# Patient Record
Sex: Female | Born: 1990 | Race: White | Hispanic: No | Marital: Married | Smoking: Former smoker
Health system: Southern US, Community
[De-identification: ages and names within clinical notes are randomized; demographics above are authoritative.]

## PROBLEM LIST (undated history)

## (undated) DIAGNOSIS — Q210 Ventricular septal defect: Secondary | ICD-10-CM

## (undated) DIAGNOSIS — R0600 Dyspnea, unspecified: Secondary | ICD-10-CM

## (undated) DIAGNOSIS — K047 Periapical abscess without sinus: Secondary | ICD-10-CM

## (undated) DIAGNOSIS — O24419 Gestational diabetes mellitus in pregnancy, unspecified control: Secondary | ICD-10-CM

## (undated) HISTORY — PX: OTHER SURGICAL HISTORY: SHX169

## (undated) HISTORY — DX: Dyspnea, unspecified: R06.00

## (undated) HISTORY — DX: Gestational diabetes mellitus in pregnancy, unspecified control: O24.419

## (undated) HISTORY — PX: VSD REPAIR: SHX276

## (undated) HISTORY — DX: Periapical abscess without sinus: K04.7

---
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
## 2011-12-16 ENCOUNTER — Ambulatory Visit (INDEPENDENT_AMBULATORY_CARE_PROVIDER_SITE_OTHER): Payer: BC Managed Care – PPO | Admitting: Physician Assistant

## 2011-12-16 VITALS — BP 131/78 | HR 90 | Temp 99.5°F | Resp 16 | Ht 61.5 in | Wt 114.6 lb

## 2011-12-16 DIAGNOSIS — R069 Unspecified abnormalities of breathing: Secondary | ICD-10-CM

## 2011-12-16 DIAGNOSIS — J069 Acute upper respiratory infection, unspecified: Secondary | ICD-10-CM

## 2011-12-16 DIAGNOSIS — R059 Cough, unspecified: Secondary | ICD-10-CM

## 2011-12-16 DIAGNOSIS — R05 Cough: Secondary | ICD-10-CM

## 2011-12-16 MED ORDER — IPRATROPIUM BROMIDE 0.03 % NA SOLN: 2.0000 | Freq: Three times a day (TID) | NASAL | Status: AC

## 2011-12-16 MED ORDER — HYDROCOD POLST-CHLORPHEN POLST 10-8 MG/5ML PO LQCR: 5.0000 mL | Freq: Two times a day (BID) | ORAL | Status: DC

## 2011-12-16 MED ORDER — GUAIFENESIN ER 1200 MG PO TB12: 1.0000 | ORAL_TABLET | Freq: Two times a day (BID) | ORAL | Status: DC

## 2011-12-16 NOTE — Progress Notes (Signed)
  Subjective:    Patient ID: Kayla Bradley, female    DOB: 14-Oct-1990, 21 y.o.   MRN: [id number]  HPI  Pt presents to clinic with 4 d h/o cold symptoms.  Pt has congestion with improved rhinorrhea, ? Color.  PND with cough more productive in the am does not know color.  She irritated throat.  No OTC meds, sister has been sick.  Increased cough at night.  HA and low grade fever at beginning of illness.  Pt is a nonsmoker with no h/o asthma.  Review of Systems  Constitutional: Positive for fever. Negative for chills.  HENT: Positive for congestion, sore throat (irritated), rhinorrhea, postnasal drip and sinus pressure. Negative for ear pain.   Respiratory: Positive for cough (increase at night).   Gastrointestinal: Negative for nausea, vomiting and diarrhea.  Musculoskeletal: Negative for myalgias.  Neurological: Positive for headaches. Negative for dizziness.       Objective:   Physical Exam  Nursing note and vitals reviewed. Constitutional: She is oriented to person, place, and time. She appears well-developed and well-nourished.  HENT:  Head: Normocephalic and atraumatic.  Right Ear: Hearing, tympanic membrane, external ear and ear canal normal.  Left Ear: Hearing, tympanic membrane and external ear normal.  Nose: Mucosal edema (red) present.  Mouth/Throat: Uvula is midline and oropharynx is clear and moist.  Eyes: Conjunctivae are normal.  Neck: Neck supple.  Cardiovascular: Normal rate, regular rhythm and normal heart sounds.   Pulmonary/Chest: Effort normal and breath sounds normal.  Lymphadenopathy:    She has no cervical adenopathy.  Neurological: She is alert and oriented to person, place, and time.  Skin: Skin is warm and dry.  Psychiatric: She has a normal mood and affect. Her behavior is normal. Judgment and thought content normal.          Assessment & Plan:   1. Acute upper respiratory infections of unspecified site  chlorpheniramine-HYDROcodone  (TUSSIONEX PENNKINETIC ER) 10-8 MG/5ML LQCR, Guaifenesin (MUCINEX MAXIMUM STRENGTH) 1200 MG TB12, ipratropium (ATROVENT) 0.03 % nasal spray  2. Cough  chlorpheniramine-HYDROcodone (TUSSIONEX PENNKINETIC ER) 10-8 MG/5ML LQCR, Guaifenesin (MUCINEX MAXIMUM STRENGTH) 1200 MG TB12, ipratropium (ATROVENT) 0.03 % nasal spray   Push fluids, tylenol/motrin prn.

## 2012-08-08 ENCOUNTER — Ambulatory Visit (INDEPENDENT_AMBULATORY_CARE_PROVIDER_SITE_OTHER): Payer: BC Managed Care – PPO | Admitting: Family Medicine

## 2012-08-08 ENCOUNTER — Encounter: Payer: Self-pay | Admitting: Family Medicine

## 2012-08-08 VITALS — BP 121/62 | HR 73 | Temp 98.2°F | Resp 18 | Ht 62.0 in | Wt 111.0 lb

## 2012-08-08 DIAGNOSIS — M419 Scoliosis, unspecified: Secondary | ICD-10-CM

## 2012-08-08 DIAGNOSIS — M4120 Other idiopathic scoliosis, site unspecified: Secondary | ICD-10-CM

## 2012-08-08 MED ORDER — IBUPROFEN 800 MG PO TABS: ORAL_TABLET | ORAL | Status: DC

## 2012-08-08 MED ORDER — METAXALONE 800 MG PO TABS: ORAL_TABLET | ORAL | Status: DC

## 2012-08-08 NOTE — Patient Instructions (Signed)
Scoliosis Scoliosis is the name given to a spine that curves sideways. It is a common condition found in up to ten percent of adolescents. It is more common in teenage girls. This is sometimes the result of other underlying problems such as unequal leg length or muscular problems. Approximately 70% of the time the cause unknown. It can cause twisting of the shoulders, hips, chest, back, and rib cage. Exercises generally do not affect the course of this disease, but may be helpful in strengthening weak muscle groups. Orthopedic braces may be needed during growth spurts. Surgery may be necessary for progressive cases. HOME CARE INSTRUCTIONS   Your caregiver may suggest exercises to strengthen your muscles. Follow their instructions. Ask your caregiver if you can participate in sports activities.  Bracing may be needed to try to limit the progression of the spinal curve. Wear the brace as instructed by your caregiver.  Follow-up appointments are important. Often mild cases of scoliosis can be kept track of by regular physical exams. However, periodic x-rays may be taken in more severe cases to follow the progress of the curvature, especially with brace treatment. Scoliosis can be corrected or improved if treated early. SEEK IMMEDIATE MEDICAL CARE IF:  You have back pain that is not relieved by medications prescribed by your caregiver.  If there is weakness or increased muscle tone (spasticity) in your legs or any loss of bowel or bladder control. Document Released: 09/16/2000 Document Revised: 12/12/2011 Document Reviewed: 10/06/2008 [facility] Patient Information 2013 [place], [place].  You may want to see a Chiropractor for treatment of back pain associated with scoliosis. Have your parents check their insurance plan to see if it will cover this type of treatment. If so, then contact the practice of your choice to see if they accept the insurance and will schedule a appointment for you.

## 2012-08-08 NOTE — Progress Notes (Signed)
S:  This young 21 y.o. Cauc female is here for evaluation of back pain about 1 month ago. No hx of trauma or overuse. No other associated symptoms (no cough, fever/ chills, GU/GI symptoms). She admits that she may need a new mattress but has a styrofoam topper on her bed.   ROS: As per HPI.  O: Filed Vitals:   08/08/12 1039  BP: 121/62  Pulse: 73  Temp: 98.2 F (36.8 C)  Resp: 18   GEN: In NAD; WN,WD. HEENT: [place]/AT; otherwise normal. COR: RRR. LUNGS: CTA. SPINE: Mild thoracic curvature noted with rib hump to right with forward flexion. BACK: No CVAT. NEURO: A&O x 3; CNs intact. Nonfocal.   A/P: 1. Thoracic scoliosis     RX: Ibuprofen 800 mg 1/2 tablet 3 times a day prn RX: Skelaxin (generic) 800 mg  1/2 tablet hs for muscle spasms. Topical analgesic 2-3 times daily. Chiropractic treatment may be helpful.

## 2015-12-15 ENCOUNTER — Ambulatory Visit (INDEPENDENT_AMBULATORY_CARE_PROVIDER_SITE_OTHER): Payer: BLUE CROSS/BLUE SHIELD | Admitting: Physician Assistant

## 2015-12-15 VITALS — BP 130/80 | HR 72 | Temp 98.1°F | Resp 16 | Ht 62.0 in | Wt 114.6 lb

## 2015-12-15 DIAGNOSIS — R21 Rash and other nonspecific skin eruption: Secondary | ICD-10-CM

## 2015-12-15 DIAGNOSIS — N63 Unspecified lump in breast: Secondary | ICD-10-CM | POA: Diagnosis not present

## 2015-12-15 DIAGNOSIS — N6310 Unspecified lump in the right breast, unspecified quadrant: Secondary | ICD-10-CM

## 2015-12-15 LAB — POCT SKIN KOH: Skin KOH, POC: NEGATIVE

## 2015-12-15 NOTE — Patient Instructions (Addendum)
Pityriasis Rosea Pityriasis rosea is a rash that usually appears on the trunk of the body. It may also appear on the upper arms and upper legs. It usually begins as a single patch, and then more patches begin to develop. The rash may cause mild itching, but it normally does not cause other problems. It usually goes away without treatment. However, it may take weeks or months for the rash to go away completely. CAUSES The cause of this condition is not known. The condition does not spread from person to person (is noncontagious). RISK FACTORS This condition is more likely to develop in young adults and children. It is most common in the spring and fall. SYMPTOMS The main symptom of this condition is a rash.  The rash usually begins with a single oval patch that is larger than the ones that follow. This is called a herald patch. It generally appears a week or more before the rest of the rash appears.  When more patches start to develop, they spread quickly on the trunk, back, and arms. These patches are smaller than the first one.  The patches that make up the rash are usually oval-shaped and pink or red in color. They are usually flat, but they may sometimes be raised so that they can be felt with a finger. They may also be finely crinkled and have a scaly ring around the edge.  The rash does not typically appear on areas of the skin that are exposed to the sun. Most people who have this condition do not have other symptoms, but some have mild itching. In a few cases, a mild headache or body aches may occur before the rash appears and then go away. DIAGNOSIS Your health care provider may diagnose this condition by doing a physical exam and taking your medical history. To rule out other possible causes for the rash, the health care provider may order blood tests or take a skin sample from the rash to be looked at under a microscope. TREATMENT Usually, treatment is not needed for this condition.  The rash will probably go away on its own in 4-8 weeks. In some cases, a health care provider may recommend or prescribe medicine to reduce itching. HOME CARE INSTRUCTIONS  Take medicines only as directed by your health care provider.  Avoid scratching the affected areas of skin.  Do not take hot baths or use a sauna. Use only warm water when bathing or showering. Heat can increase itching. SEEK MEDICAL CARE IF:  Your rash does not go away in 8 weeks.  Your rash gets much worse.  You have a fever.  You have swelling or pain in the rash area.  You have fluid, blood, or pus coming from the rash area.   This information is not intended to replace advice given to you by your health care provider. Make sure you discuss any questions you have with your health care provider.   Document Released: 10/26/2001 Document Revised: 02/03/2015 Document Reviewed: 08/27/2014 Elsevier Interactive Patient Education 2016 Elsevier Inc.    IF you received an x-ray today, you will receive an invoice from [facility]. Please contact [facility] at [phone] with questions or concerns regarding your invoice.   IF you received labwork today, you will receive an invoice from [facility]. Please contact [facility] at [phone] with questions or concerns regarding your invoice.   Our billing staff will not be able to assist you with questions regarding bills from these companies.    You will be contacted with the lab results as soon as they are available. The fastest way to get your results is to activate your My Chart account. Instructions are located on the last page of this paperwork. If you have not heard from us regarding the results in 2 weeks, please contact this office.      

## 2015-12-15 NOTE — Progress Notes (Signed)
Subjective:     Patient ID: Kayla Bradley,Kayla Bradley female   DOB: 02/03/1991, 25 y.o.   MRN: [id number]  HPI The patient is a 25 year old female with no significant past medical history who presents with a rash x3 weeks. The patient states she notices a spot on the corner of her right breast 3 weeks ago, then the rash spread to her upper extremities, abdomen and trunk. She denies itching, states the rash is noticeable but not bothersome. Denies pain or tenderness. No recent nausea, vomiting or sun exposure. The patient has no change in lotions, medications, conditioners, no recent travel or camping exposure and no known family members or partners with a similar rash. Denies fever, chills or night sweats.  The patient also states the noticed a breast mass 1 week ago on her right breast that has been persistent since. It has not increased in size, changes shape or been painful since discovered  Review of Systems All the pertinent ROS as above in HPI    Objective:   Physical Exam  Constitutional: She appears well-developed and well-nourished. No distress.  HENT:  Head: Normocephalic and atraumatic.  Neck: Neck supple.  Cardiovascular: Normal rate, regular rhythm, normal heart sounds and intact distal pulses.  Exam reveals no gallop and no friction rub.   No murmur heard. Pulmonary/Chest: Breath sounds normal. No respiratory distress. She has no wheezes. She has no rales.  Genitourinary: There is breast tenderness.  Deep breast mass at 12 o'clock superior to the areola, mildly tender, 4cm length by 3cm wide, firm, non-mobile, anchored to underlying tissue.  Lymphadenopathy:    She has no cervical adenopathy.  Skin:  Dry, scaly patch with erythematous boarder and central clearing on the right breast at 10 o'clock. Erythematous dry patches of varying sizes disseminated over back, anterior chest wall, abdomen and flexor upper extremity surfaces.   Results for orders placed or performed in visit on  12/15/15  POCT Skin KOH  Result Value Ref Range   Skin KOH, POC Negative        Assessment:     The patient is a 25 year old female with no significant past medical history who presents with a rash X3 weeks, starting in one spot and migrated to several body surfaces. No pain or itching associated. 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 Also, large breast mass in a 25 year old female with no significant history, send in referral for ultrasound of breast at this time.     Plan:     1. Rash and nonspecific skin eruption - POCT Skin KOH Negative - Encouraged patient to use OTC hydrocortisone cream if rash becomes pruritic - Reassured patient rash should be self-limiting, if rash persists or worsens please come back to the office  2. Breast mass, right - US BREAST LTD UNI RIGHT INC AXILLA; Future

## 2015-12-18 NOTE — Progress Notes (Signed)
   Concha SeKatherine M Martelli  MRN: [id number] DOB: 06/05/1991  Subjective:  The patient is a 25 year old female with no significant past medical history who presents with a rash x3 weeks. The patient states she notices a spot on the corner of her right breast 3 weeks ago, then the rash spread to her upper extremities, abdomen and trunk. She denies itching, states the rash is noticeable but not bothersome. Denies pain or tenderness. No recent nausea, vomiting or sun exposure. The patient has no change in lotions, medications, conditioners, no recent travel or camping exposure and no known family members or partners with a similar rash. Denies fever, chills or night sweats.  The patient also states the noticed a breast mass 1 week ago on her right breast that has been persistent since. It has not increased in size, changes shape or been painful since discovered   Patient Active Problem List   Diagnosis Date Noted  . Thoracic scoliosis 08/08/2012    Current Outpatient Prescriptions on File Prior to Visit  Medication Sig Dispense Refill  . Guaifenesin (MUCINEX MAXIMUM STRENGTH) 1200 MG TB12 Take 1 tablet (1,200 mg total) by mouth 2 (two) times daily. (Patient not taking: Reported on 12/15/2015) 14 each 0  . ibuprofen (ADVIL,MOTRIN) 800 MG tablet Take 1/2 tablet 3 times a day with food prn pain. (Patient not taking: Reported on 12/15/2015) 60 tablet 0  . metaxalone (SKELAXIN) 800 MG tablet Take 1/2 tablet at bedtime to relax back muscles. (Patient not taking: Reported on 12/15/2015) 40 tablet 0  . Pseudoephedrine-APAP-DM (DAYQUIL MULTI-SYMPTOM) 60-650-20 MG/30ML LIQD Take by mouth. Reported on 12/15/2015     No current facility-administered medications on file prior to visit.    No Known Allergies  Review of Systems Objective:  BP 130/80 mmHg  Pulse 72  Temp(Src) 98.1 F (36.7 C) (Oral)  Resp 16  Ht 5\' 2"  (1.575 m)  Wt 114 lb 9.6 oz (51.982 kg)  BMI 20.96 kg/m2  SpO2 99%  LMP  12/11/2015  Physical Exam  Constitutional: She is oriented to person, place, and time and well-developed, well-nourished, and in no distress.  HENT:  Head: Normocephalic and atraumatic.  Right Ear: Hearing and external ear normal.  Left Ear: Hearing and external ear normal.  Eyes: Conjunctivae are normal.  Neck: Normal range of motion.  Cardiovascular: Normal rate, regular rhythm and normal heart sounds.   No murmur heard. Pulmonary/Chest: Effort normal and breath sounds normal. She has no wheezes.    Neurological: She is alert and oriented to person, place, and time. Gait normal.  Skin: Skin is warm and dry. Rash (light erythematous oval pataches with central clearing and some edge scaling - larger paler colored lesion on anterior right chest wall) noted.  Psychiatric: Mood, memory, affect and judgment normal.  Vitals reviewed.  Results for orders placed or performed in visit on 12/15/15  POCT Skin KOH  Result Value Ref Range   Skin KOH, POC Negative     Assessment and Plan :  Rash and nonspecific skin eruption - Plan: POCT Skin KOH - likely pityriasis rosea - d/w pt the self limiting process that may take 4-6 weeks for complete resolution.  Breast mass, right - Plan: US BREAST LTD UNI RIGHT INC AXILLA - check US to look at type of lesion.  Her questions were answered and she agreed with the plan  Benny LennertSarah Keino Placencia PA-C  [facility] 12/18/2015 10:12 AM

## 2016-03-30 ENCOUNTER — Telehealth: Payer: Self-pay

## 2016-03-30 NOTE — Telephone Encounter (Signed)
Patient is calling to see if she can get a referral for a mammogram. Patient states that she [facility] and [facility]'t able to. I informed patient that referral tried to reach her.  Please advise! [phone]

## 2016-03-31 NOTE — Telephone Encounter (Signed)
The patient is too young for a mammogram, but she can have a breast ultrasound performed.  There is an order in the system from March that is still open, so [facility] can [facility] and schedule with them.  It appears they had tried to reach her several times with the number listed in the system, but had been unsuccessful.  Their phone number is [phone].

## 2016-04-06 ENCOUNTER — Ambulatory Visit
Admission: RE | Admit: 2016-04-06 | Discharge: 2016-04-06 | Disposition: A | Discharge status: Home / Self Care | Payer: BLUE CROSS/BLUE SHIELD | Source: Ambulatory Visit | Attending: Physician Assistant | Admitting: Physician Assistant

## 2016-04-06 DIAGNOSIS — N6310 Unspecified lump in the right breast, unspecified quadrant: Secondary | ICD-10-CM

## 2016-08-26 ENCOUNTER — Ambulatory Visit (INDEPENDENT_AMBULATORY_CARE_PROVIDER_SITE_OTHER): Payer: BLUE CROSS/BLUE SHIELD | Admitting: Family Medicine

## 2016-08-26 VITALS — BP 118/80 | HR 74 | Temp 98.2°F | Resp 16 | Ht 62.0 in | Wt 105.0 lb

## 2016-08-26 DIAGNOSIS — M25541 Pain in joints of right hand: Secondary | ICD-10-CM

## 2016-08-26 DIAGNOSIS — D72828 Other elevated white blood cell count: Secondary | ICD-10-CM

## 2016-08-26 DIAGNOSIS — R21 Rash and other nonspecific skin eruption: Secondary | ICD-10-CM | POA: Diagnosis not present

## 2016-08-26 DIAGNOSIS — R112 Nausea with vomiting, unspecified: Secondary | ICD-10-CM | POA: Diagnosis not present

## 2016-08-26 DIAGNOSIS — R109 Unspecified abdominal pain: Secondary | ICD-10-CM

## 2016-08-26 DIAGNOSIS — R590 Localized enlarged lymph nodes: Secondary | ICD-10-CM | POA: Diagnosis not present

## 2016-08-26 DIAGNOSIS — M25542 Pain in joints of left hand: Secondary | ICD-10-CM

## 2016-08-26 LAB — COMPLETE METABOLIC PANEL WITH GFR
ALBUMIN: 4.7 g/dL (ref 3.6–5.1)
ALK PHOS: 41 U/L (ref 33–115)
ALT: 8 U/L (ref 6–29)
AST: 12 U/L (ref 10–30)
BILIRUBIN TOTAL: 0.3 mg/dL (ref 0.2–1.2)
BUN: 9 mg/dL (ref 7–25)
CO2: 25 mmol/L (ref 20–31)
CREATININE: 0.71 mg/dL (ref 0.50–1.10)
Calcium: 9.5 mg/dL (ref 8.6–10.2)
Chloride: 104 mmol/L (ref 98–110)
GFR, Est African American: >89 mL/min (ref >=60)
GFR, Est Non African American: >89 mL/min (ref >=60)
GLUCOSE: 91 mg/dL (ref 65–99)
Potassium: 4.2 mmol/L (ref 3.5–5.3)
SODIUM: 137 mmol/L (ref 135–146)
TOTAL PROTEIN: 7.2 g/dL (ref 6.1–8.1)

## 2016-08-26 LAB — POCT CBC
Granulocyte percent: 68.2 %G (ref 37–80)
HEMATOCRIT: 41.2 % (ref 37.7–47.9)
HEMOGLOBIN: 14.7 g/dL (ref 12.2–16.2)
LYMPH, POC: 2.8 (ref 0.6–3.4)
MCH, POC: 33.1 pg — AB (ref 27–31.2)
MCHC: 35.8 g/dL — AB (ref 31.8–35.4)
MCV: 92.6 fL (ref 80–97)
MID (cbc): 0.9 (ref 0–0.9)
MPV: 7.2 fL (ref 0–99.8)
POC GRANULOCYTE: 7.9 — AB (ref 2–6.9)
POC LYMPH %: 24.3 % (ref 10–50)
POC MID %: 7.5 %M (ref 0–12)
Platelet Count, POC: 232 10*3/uL (ref 142–424)
RBC: 4.45 M/uL (ref 4.04–5.48)
RDW, POC: 12.3 %
WBC: 11.6 10*3/uL — AB (ref 4.6–10.2)

## 2016-08-26 LAB — POCT URINE PREGNANCY: Preg Test, Ur: NEGATIVE

## 2016-08-26 MED ORDER — TRIAMCINOLONE ACETONIDE 0.1 % EX CREA: 1.0000 "application " | TOPICAL_CREAM | Freq: Two times a day (BID) | CUTANEOUS | 0 refills | Status: DC

## 2016-08-26 MED ORDER — DOXYCYCLINE HYCLATE 100 MG PO CAPS: 100.0000 mg | ORAL_CAPSULE | Freq: Two times a day (BID) | ORAL | 0 refills | Status: AC

## 2016-08-26 NOTE — Patient Instructions (Addendum)
  Start Doxycycline with food, 100 mg twice daily for 21 days for facial rash.    Apply triamcinolone twice daily to affected areas. I will notify you of your labs.  If all are normal, I will refer you to dermatology.  [name], MSN, FNP-C [facility]  IF you received an x-ray today, you will receive an invoice from [facility]. Please contact [facility] at [phone] with questions or concerns regarding your invoice.   IF you received labwork today, you will receive an invoice from [facility]. Please contact [facility] at [phone] with questions or concerns regarding your invoice.   Our billing staff will not be able to assist you with questions regarding bills from these companies.  You will be contacted with the lab results as soon as they are available. The fastest way to get your results is to activate your My Chart account. Instructions are located on the last page of this paperwork. If you have not heard from us regarding the results in 2 weeks, please contact this office.

## 2016-08-26 NOTE — Progress Notes (Signed)
Patient ID: Kayla Bradley, female    DOB: 01/27/1991, 25 y.o.   MRN: [id number]  PCP: No PCP Per Patient  Chief Complaint  Patient presents with  . FaConcha Secial rash    Right side of face x 3 days    Subjective:   HPI 25 year old female presents for evaluation of facial rash.  [facility]  Patient provides an account of skin rashes at least three since late winter of 2017 in which she was seen and evaluated [facility] and at area urgent cares. She reports the current rash on her face was dx at another facility as impetigo and she was treated with a topical. Rash never resolved.  One rash [facility] was [facility] appeared upper right breast later spreading to her stomach which she reports is similar to the rash she has today.  She is almost certain she received treatment with an oral medication although not certain of which medication. Reports an almost 15 lb weight loss without trying or changing dietary intake. In addition to weight loss, over the last several month she complains of joint pain hands, knees, arms, and legs. This week, she has experienced some abdominal pain with nausea and one episode of vomiting.  Social History   Social History  . Marital status: Single    Spouse name: N/A  . Number of children: N/A  . Years of education: N/A   Occupational History  . Host     [employer]   Social History Main Topics  . Smoking status: Never Smoker  . Smokeless tobacco: Not on file  . Alcohol use 0.6 oz/week    1 Cans of beer per week  . Drug use:     Frequency: 7.0 times per week    Types: Marijuana  . Sexual activity: Yes    Birth control/ protection: Condom   Other Topics Concern  . Not on file   Social History Narrative  . No narrative on file   Family History  Problem Relation Age of Onset  . Diabetes Father   . Heart disease Maternal Grandfather    Review of Systems See HPI  Patient Active Problem  List   Diagnosis Date Noted  . Thoracic scoliosis 08/08/2012     Prior to Admission medications   Medication Sig Start Date End Date Taking? Authorizing Provider  metaxalone (SKELAXIN) 800 MG tablet Take 1/2 tablet at bedtime to relax back muscles. Patient not taking: Reported on 08/26/2016 08/08/12   Maurice MarchBarbara B McPherson, MD   No Known Allergies     Objective:  Physical Exam  Constitutional: She is oriented to person, place, and time. She appears well-developed and well-nourished.  HENT:  Head: Normocephalic and atraumatic.  Eyes: Pupils are equal, round, and reactive to light.  Neck: Normal range of motion. Neck supple. No thyromegaly present.  Cardiovascular: Normal rate, regular rhythm, normal heart sounds and intact distal pulses.   Pulmonary/Chest: Effort normal and breath sounds normal.  Musculoskeletal: Normal range of motion.  Lymphadenopathy:    She has cervical adenopathy.  Neurological: She is alert and oriented to person, place, and time.  Skin: Skin is warm and dry. Rash noted. Rash is macular.     Psychiatric: She has a normal mood and affect. Her behavior is normal. Judgment and thought content normal.   Vitals:   08/26/16 1010  BP: 118/80  Pulse: 74  Resp: 16  Temp:  98.2 F (36.8 C)     Assessment & Plan:  1. Rash and nonspecific skin eruption 2. Arthralgia of both hands 3. Adenopathy, cervical 4. Nausea and vomiting, intractability of vomiting not specified, unspecified vomiting type 5. Abdominal pain, unspecified abdominal location 6. Other elevated white blood cell (WBC) count  [age] year old female, presents with a cluster of symptoms including unintentional weight loss, joint pain, and reoccurring skin rash. Positive ANA and anti-nuclear anti-body, indicates possible autoimmune process. Referring to Rheumatology for further work-up. CBC indicated an elevated WBC with elevated granulocytes. Rash on face erythremic with increased warmth.   -Start  Doxycycline with food, 100 mg twice daily for 21 days for facial rash.    Apply triamcinolone twice daily to affected areas of your face.  Return for follow-up as needed.  [name], MSN, FNP-C [facility]

## 2016-08-27 LAB — SEDIMENTATION RATE: Sed Rate: 1 mm/hr (ref 0–20)

## 2016-08-27 LAB — GC/CHLAMYDIA PROBE AMP
CT PROBE, AMP APTIMA: NOT DETECTED
GC Probe RNA: NOT DETECTED

## 2016-08-27 LAB — HIV ANTIBODY (ROUTINE TESTING W REFLEX): HIV: NONREACTIVE

## 2016-08-27 LAB — RPR

## 2016-08-29 LAB — ANA, IFA COMPREHENSIVE PANEL
Anti Nuclear Antibody(ANA): POSITIVE — AB
DS DNA AB: 2 [IU]/mL
ENA SM AB SER-ACNC: NEGATIVE
SCLERODERMA (SCL-70) (ENA) ANTIBODY, IGG: NEGATIVE
SM/RNP: NEGATIVE
SSA (Ro) (ENA) Antibody, IgG: <1
SSB (LA) (ENA) ANTIBODY, IGG: NEGATIVE

## 2016-08-29 LAB — ANTI-NUCLEAR AB-TITER (ANA TITER)

## 2016-08-30 ENCOUNTER — Encounter: Payer: Self-pay | Admitting: Family Medicine

## 2016-10-28 NOTE — Progress Notes (Signed)
Office Visit Note  Patient: Kayla Bradley             Date of Birth: 28-Feb-1991           MRN: [id number]             PCP: Kathlen Brunswick, PA-C Referring: Molli Barrows ,FNP Visit Date: 10/31/2016 Occupation:Hostess at a restaurant    Subjective:  Rash and fatigue   History of Present Illness: Kayla Bradley is a 26 y.o. female seen in consultation per request of her PCP for positive ANA. According to patient in December 2016 she had unexplained weight loss of about 20 pounds. Few months later she developed an episode of nausea and vomiting and then onset of rash on her abdomen and elbows. She states the lash was pruritic and lasted for about 1 month. She was seen at the urgent care where she was diagnosed with pityriasis no treatment was given. She states about 3 months later she had an episode of nausea and vomiting and then rash was all over her face chest and her extremities. She also notices that she had cervical lymph node enlargement. She was given a course of antibiotics and the lymph nodes went down but does state swollen to some extent. She states once the antibiotics finished her rash recurred in November 2017. She was given another course of antibiotics. This time the rash did not resolve. She had a set of labs which showed positive ANA and she was referred here. She states she has not had any further weight loss but she continues to be very tired. She has stiffness in her upper back. She's also noticed some swelling in her right first MCP joint. Which is not painful.  Activities of Daily Living:  Patient reports morning stiffness for 0 minute.   Patient Denies nocturnal pain.  Difficulty dressing/grooming: Denies Difficulty climbing stairs: Denies Difficulty getting out of chair: Denies Difficulty using hands for taps, buttons, cutlery, and/or writing: Denies   Review of Systems  Constitutional: Positive for fatigue and weight loss. Negative for night  sweats, weight gain and weakness.  HENT: Positive for mouth dryness. Negative for mouth sores, trouble swallowing, trouble swallowing and nose dryness.   Eyes: Positive for dryness. Negative for pain, redness and visual disturbance.  Respiratory: Negative for cough, shortness of breath and difficulty breathing.   Cardiovascular: Negative for chest pain, palpitations, hypertension, irregular heartbeat and swelling in legs/feet.  Gastrointestinal: Positive for diarrhea and nausea. Negative for blood in stool and constipation.       3 loose the stools a day off and on for one year  Endocrine: Negative for increased urination.  Genitourinary: Negative for vaginal dryness.  Musculoskeletal: Positive for arthralgias, joint pain, myalgias and myalgias. Negative for joint swelling, muscle weakness, morning stiffness and muscle tenderness.  Skin: Positive for rash. Negative for color change, hair loss, skin tightness, ulcers and sensitivity to sunlight.  Allergic/Immunologic: Negative for susceptible to infections.  Neurological: Negative for dizziness, memory loss and night sweats.  Hematological: Positive for swollen glands.       Cervical lymph nodes  Psychiatric/Behavioral: Positive for depressed mood and sleep disturbance. The patient is nervous/anxious.     PMFS History:  Patient Active Problem List   Diagnosis Date Noted  . 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  Family History  Problem Relation Age of Onset  . Diabetes Father   . Heart disease Maternal Grandfather  Past Surgical History:  Procedure Laterality Date  . heart defect fixed     Social History   Social History Narrative  . No narrative on file     Objective: Vital Signs: BP 110/60 (BP Location: Left Arm)   Pulse 88   Resp 14   Ht _0  (1.575 m)   Wt 102 lb (46.3 kg)   LMP 10/31/2016 (Exact Date)   BMI 18.66 kg/m    Physical Exam  Constitutional: She is oriented to person,  place, and time. She appears well-developed and well-nourished.  HENT:  Head: Normocephalic and atraumatic.  Eyes: Conjunctivae and EOM are normal.  Neck: Normal range of motion.  Cardiovascular: Normal rate, regular rhythm, normal heart sounds and intact distal pulses.   Pulmonary/Chest: Effort normal and breath sounds normal.  Abdominal: Soft. Bowel sounds are normal.  Lymphadenopathy:    She has no cervical adenopathy.  Neurological: She is alert and oriented to person, place, and time.  Skin: Skin is warm and dry. Capillary refill takes less than 2 seconds.  Erythematous papules on her face and neck upper and lower extremities with excoriation   Psychiatric: She has a normal mood and affect. Her behavior is normal.  Nursing note and vitals reviewed.    Musculoskeletal Exam: C-spine, thoracic, lumbar spine good range of motion. Shoulder joints elbow joints wrist joint MCPs PIPs DIPs with good range of motion with no synovitis hip joints knee joints ankles MTPs PIPs DIPs with good range of motion with no synovitis.  CDAI Exam: No CDAI exam completed.    Investigation: Findings:  08/26/2016 ANA 1:80 speckled, ENA negative, CBC normal, CMP normal, ESR 1, urine pregnancy test negative, HIV negative, RPR negative, GC probe negative    Imaging: No results found.  Speciality Comments: No specialty comments available.    Procedures:  No procedures performed Allergies: Patient has no known allergies.   Assessment / Plan:     Visit Diagnoses: Positive ANA (antinuclear antibody) - ANA 1:80 speckled - Plan: C3 and C4. She has no clinical features of autoimmune disease. She complains of fatigue. I'm uncertain if it's related to her positive ANA. She also gives history of weight loss nausea or vomiting and persistent diarrhea for going on for one year. I would refer her to gastroenterology for that.  Pain in both hands: She gives history of the stiffness in her hands I do not see any  synovitis on examination today.  Rash -she had papular rash on her face and extremities to the excoriation marks. It appears to be impetigo. I would like for her to see dermatology. She will see her PCP for that referral. Plan: Pan-ANCA  Cervical lymphadenopathy: Probably related to her rash.  Other fatigue - Plan: TSH, CK, Protein electrophoresis, serum, IgG, IgA, IgM  Diarrhea, unspecified type, patient reports 3-4 loose stools every day without any blood and mucus she also is nauseated daily. I'll make a GI referral  - Plan: HLA-B27 antigen    Orders: Orders Placed This Encounter  Procedures  . C3 and C4  . TSH  . CK  . Protein electrophoresis, serum  . IgG, IgA, IgM  . Pan-ANCA  . HLA-B27 antigen   No orders of the defined types were placed in this encounter.   Face-to-face time spent with patient was 50 minutes. 50% of time was spent in counseling and coordination of care.  Follow-Up Instructions: Return for Rash and fatigue.   Bo Merino, MD  Note - This  record has been created using [employer].  Chart creation errors have been sought, but may not always  have been located. Such creation errors do not reflect on  the standard of medical care.

## 2016-10-31 ENCOUNTER — Other Ambulatory Visit: Payer: Self-pay | Admitting: Family Medicine

## 2016-10-31 ENCOUNTER — Encounter: Payer: Self-pay | Admitting: Rheumatology

## 2016-10-31 ENCOUNTER — Ambulatory Visit (INDEPENDENT_AMBULATORY_CARE_PROVIDER_SITE_OTHER): Payer: BLUE CROSS/BLUE SHIELD | Admitting: Rheumatology

## 2016-10-31 VITALS — BP 110/60 | HR 88 | Resp 14 | Ht 62.0 in | Wt 102.0 lb

## 2016-10-31 DIAGNOSIS — M79641 Pain in right hand: Secondary | ICD-10-CM

## 2016-10-31 DIAGNOSIS — R590 Localized enlarged lymph nodes: Secondary | ICD-10-CM | POA: Diagnosis not present

## 2016-10-31 DIAGNOSIS — R197 Diarrhea, unspecified: Secondary | ICD-10-CM | POA: Diagnosis not present

## 2016-10-31 DIAGNOSIS — R768 Other specified abnormal immunological findings in serum: Secondary | ICD-10-CM

## 2016-10-31 DIAGNOSIS — R5383 Other fatigue: Secondary | ICD-10-CM

## 2016-10-31 DIAGNOSIS — M79642 Pain in left hand: Secondary | ICD-10-CM | POA: Diagnosis not present

## 2016-10-31 DIAGNOSIS — R21 Rash and other nonspecific skin eruption: Secondary | ICD-10-CM

## 2016-10-31 LAB — TSH: TSH: 2.87 mIU/L

## 2016-10-31 NOTE — Progress Notes (Signed)
Received a copy of progress notes from patient's visit with Dr. Corliss Skainseveshwar, Rheumatologist. Per his recommendation I am referring patient to dermatology for further evaluation of chronic facial rash eruptions. See note 10/31/2016 for referral details and send progress note from Dr. Corliss Skainseveshwar and myself last OV 08/26/2016, if required for referral.   [name], MSN, FNP-C [facility] [phone]

## 2016-11-01 ENCOUNTER — Telehealth: Payer: Self-pay | Admitting: Radiology

## 2016-11-01 LAB — IGG, IGA, IGM
IgA: 113 mg/dL (ref 81–463)
IgG (Immunoglobin G), Serum: 909 mg/dL (ref 694–1618)
IgM, Serum: 168 mg/dL (ref 48–271)

## 2016-11-01 LAB — C3 AND C4
C3 Complement: 98 mg/dL (ref 90–180)
C4 Complement: 22 mg/dL (ref 16–47)

## 2016-11-01 LAB — CK: Total CK: 65 U/L (ref 7–177)

## 2016-11-01 NOTE — Telephone Encounter (Signed)
Information  received via fax for patient/ she has appointment with Dr Loreta AveMann on 11/29/16 at 8:45

## 2016-11-02 LAB — PROTEIN ELECTROPHORESIS, SERUM
ALPHA-1-GLOBULIN: 0.3 g/dL (ref 0.2–0.3)
ALPHA-2-GLOBULIN: 0.7 g/dL (ref 0.5–0.9)
Albumin ELP: 4.5 g/dL (ref 3.8–4.8)
Beta 2: 0.3 g/dL (ref 0.2–0.5)
Beta Globulin: 0.4 g/dL (ref 0.4–0.6)
GAMMA GLOBULIN: 1 g/dL (ref 0.8–1.7)
Total Protein, Serum Electrophoresis: 7.2 g/dL (ref 6.1–8.1)

## 2016-11-02 LAB — PAN-ANCA: ANCA Screen: NEGATIVE

## 2016-11-02 NOTE — Progress Notes (Signed)
Labs normal we'll discuss at follow-up

## 2016-11-07 LAB — HLA-B27 ANTIGEN: DNA RESULT: NEGATIVE

## 2016-11-08 NOTE — Progress Notes (Signed)
Labs normal, we will discuss at follow-up. Keep appointment scheduled for 12/02/2016

## 2016-11-22 DIAGNOSIS — R768 Other specified abnormal immunological findings in serum: Secondary | ICD-10-CM | POA: Insufficient documentation

## 2016-11-22 DIAGNOSIS — R5383 Other fatigue: Secondary | ICD-10-CM | POA: Insufficient documentation

## 2016-11-22 NOTE — Progress Notes (Signed)
Office Visit Note  Patient: Kayla Bradley             Date of Birth: [birth year]           MRN: [id number]             PCP: Kathlen Brunswick, PA-C Referring: No ref. provider found Visit Date: 12/02/2016 Occupation: '@GUAROCC' @    Subjective:  Pain hands.   History of Present Illness: Kayla Bradley is a 26 y.o. female with history of arthralgias and rash. According to patient the rash resolved. She went to see the dermatologist who also felt that the rash was consistent with impetigo. He has intermittent pain in her hands. She notices swelling in her right first MCP joint.  Activities of Daily Living:  Patient reports morning stiffness for 0 minutes.   Patient Denies nocturnal pain.  Difficulty dressing/grooming: Denies Difficulty climbing stairs: Denies Difficulty getting out of chair: Denies Difficulty using hands for taps, buttons, cutlery, and/or writing: Denies   Review of Systems  Constitutional: Positive for fatigue. Negative for night sweats, weight gain, weight loss and weakness.  HENT: Negative for mouth sores, trouble swallowing, trouble swallowing, mouth dryness and nose dryness.   Eyes: Positive for dryness. Negative for pain, redness and visual disturbance.  Respiratory: Negative for cough, shortness of breath and difficulty breathing.   Cardiovascular: Negative for chest pain, palpitations, hypertension, irregular heartbeat and swelling in legs/feet.  Gastrointestinal: Negative for blood in stool, constipation and diarrhea.  Endocrine: Negative for increased urination.  Genitourinary: Negative for vaginal dryness.  Musculoskeletal: Positive for arthralgias and joint pain. Negative for joint swelling, myalgias, muscle weakness, morning stiffness, muscle tenderness and myalgias.  Skin: Negative for color change, rash, hair loss, skin tightness, ulcers and sensitivity to sunlight.  Allergic/Immunologic: Negative for susceptible to infections.    Neurological: Negative for dizziness, memory loss and night sweats.  Hematological: Negative for swollen glands.  Psychiatric/Behavioral: Positive for depressed mood and sleep disturbance. The patient is nervous/anxious.     PMFS History:  Patient Active Problem List   Diagnosis Date Noted  . ANA positive 11/22/2016  . Other fatigue 11/22/2016  . 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  Family History  Problem Relation Age of Onset  . Diabetes Father   . Heart disease Maternal Grandfather    Past Surgical History:  Procedure Laterality Date  . heart defect fixed     Social History   Social History Narrative  . No narrative on file     Objective: Vital Signs: BP 102/72   Pulse 78   Resp 14   Wt 100 lb (45.4 kg)   LMP 11/30/2016   BMI 18.29 kg/m    Physical Exam  Constitutional: She is oriented to person, place, and time. She appears well-developed and well-nourished.  HENT:  Head: Normocephalic and atraumatic.  Eyes: Conjunctivae and EOM are normal.  Neck: Normal range of motion.  Cardiovascular: Normal rate, regular rhythm, normal heart sounds and intact distal pulses.   Pulmonary/Chest: Effort normal and breath sounds normal.  Abdominal: Soft. Bowel sounds are normal.  Lymphadenopathy:    She has no cervical adenopathy.  Neurological: She is alert and oriented to person, place, and time.  Skin: Skin is warm and dry. Capillary refill takes less than 2 seconds.  Psychiatric: She has a normal mood and affect. Her behavior is normal.  Nursing note and vitals reviewed.    Musculoskeletal Exam: C-spine and thoracic lumbar  spine good range of motion no SI joint tenderness. Shoulder joints elbow joints wrist joint MCPs PIPs DIPs with good range of motion with no synovitis. Hip joints knee joints ankles MTPs PIPs DIPs with good range of motion with no synovitis. She has mild tenderness over right first MCP joint.  CDAI Exam: No CDAI exam  completed.    Investigation: Findings:  08/26/2016 ANA 1:80 speckled, ENA negative, CBC normal, CMP normal, ESR 1, urine pregnancy test negative, HIV negative, RPR negative, GC probe negative 10/31/2016 C3-C4 normal, and ANCA negative, HLA-B27 negative, TSH normal, CK 65, immunoglobulins normal, SPEP normal   Office Visit on 10/31/2016  Component Date Value Ref Range Status  . C3 Complement 10/31/2016 98  90 - 180 mg/dL Final  . C4 Complement 10/31/2016 22  16 - 47 mg/dL Final  . TSH 10/31/2016 2.87  mIU/L Final   Comment:   Reference Range   > or = 20 Years  0.40-4.50   Pregnancy Range First trimester  0.26-2.66 Second trimester 0.55-2.73 Third trimester  0.43-2.91     . Total CK 10/31/2016 65  7 - 177 U/L Final  . Total Protein, Serum Electrophores* 10/31/2016 7.2  6.1 - 8.1 g/dL Final  . Albumin ELP 10/31/2016 4.5  3.8 - 4.8 g/dL Final  . Alpha-1-Globulin 10/31/2016 0.3  0.2 - 0.3 g/dL Final  . Alpha-2-Globulin 10/31/2016 0.7  0.5 - 0.9 g/dL Final  . Beta Globulin 10/31/2016 0.4  0.4 - 0.6 g/dL Final  . Beta 2 10/31/2016 0.3  0.2 - 0.5 g/dL Final  . Gamma Globulin 10/31/2016 1.0  0.8 - 1.7 g/dL Final  . Abnormal Protein Band1 10/31/2016 NOT DET  g/dL Final  . SPE Interp. 10/31/2016 SEE NOTE   Final   Comment: Normal Electrophoretic Pattern Reviewed by Odis Hollingshead, MD, PhD, FCAP (Electronic Signature on File)   . Abnormal Protein Band2 10/31/2016 NOT DET  g/dL Final  . Abnormal Protein Band3 10/31/2016 NOT DET  g/dL Final  . IgG (Immunoglobin G), Serum 10/31/2016 909  694 - 1,618 mg/dL Final  . IgA 10/31/2016 113  81 - 463 mg/dL Final  . IgM, Serum 10/31/2016 168  48 - 271 mg/dL Final  . ANCA Screen 10/31/2016 Negative   Final   Comment:             ** Normal Reference Range: Negative **   ANCA Screen includes evaluation for p-ANCA, c-ANCA and Atypical p-ANCA.   Marland Kitchen Myeloperoxidase Abs 10/31/2016 <1.0  <1.0 AI Final   Comment:                                  Value   Interpretation                              <1.0 AI: No Antibody Detected                           >or=1.0 AI: Antibody Detected   Autoantibodies to myeloperoxidase (MPO) are commonly associated with the following small-vessel vasculitides: microscopic polyangiitis, polyarteritis nodosa, Churg-Strauss syndrome, necrotizing and crescentic glomerulonephritis and occasionally Wegener's granulomatosis. The perinuclear IFA pattern, (p-ANCA), is based largely on autoantibody to myeloperoxidase which serves as the primary antigen   These autoantibodies are present in active disease state.     . Serine Protease 3 10/31/2016 <  1.0  <1.0 AI Final   Comment:                                 Value   Interpretation                              <1.0 AI: No Antibody Detected                           >or=1.0 AI: Antibody Detected   Autoantibodies to proteinase-3 (PR-3) are accepted as characteristic for granulomatosis with polyangiitis (Wegener's), and are detectable in 95% of the histologically proven cases. The cytoplasmic IFA pattern, (c-ANCA), is based largely on autoantibody to PR-3 which serves as the primary antigen.   These autoantibodies are present in active disease state.     . DNA Result: 10/31/2016 Negative  Negative Final  . Results reviewed by: 10/31/2016 REPORT   Final   Comment: Forestine Na, Ph.D.,FACMG Director, Molecular Genetics The B27 allele group of the HLA-B locus is present in 2 to 9% of the general population.  About 20% of HLA-B27 carriers develop autoimmune disorders including Ankylosing Spondylitis (AS), Reactive Arthritis, Psoriatic Arthritis, Undifferentiated Oligoarthritis, Uveitis, or Inflammatory Bowel Disease. The highest association is with AS, where approximately 95% of AS patients are HLA-B27 positive. Genetic counseling as needed. Typing performed by PCR and hybridization with sequence specific oligonucleotide probes (SSO)  using the FDA-cleared LABType(R) SSO Kit.     Imaging: No results found.  Speciality Comments: No specialty comments available.    Procedures:  No procedures performed Allergies: Patient has no known allergies.   Assessment / Plan:     Visit Diagnoses: Pain in both hands: Patient complains of intermittent pain in her hands. She describes some tenderness in her right first MCP joint. I offered ultrasound of her right first MCP but she declined. She states if her symptoms get 4 she will notify me. I went over the features of autoimmune disease and if she develops new symptoms she should contact me.  ANA positive - 1:80, ENA negative, C3-C4 normal, no clinical features of autoimmune disease. All other autoimmune labs were negative.  Other fatigue: She continues to have some fatigue and anxiety.  Rash  - Probable impetigo. The rash resolved itself.  Diarrhea, unspecified type : Resolved   Orders: No orders of the defined types were placed in this encounter.  No orders of the defined types were placed in this encounter.   Face-to-face time spent with patient was 25 minutes. 50% of time was spent in counseling and coordination of care.  Follow-Up Instructions: Return if symptoms worsen or fail to improve.   Bo Merino, MD  Note - This record has been created using [employer].  Chart creation errors have been sought, but may not always  have been located. Such creation errors do not reflect on  the standard of medical care.

## 2016-11-29 ENCOUNTER — Ambulatory Visit: Payer: BLUE CROSS/BLUE SHIELD

## 2016-12-02 ENCOUNTER — Ambulatory Visit (INDEPENDENT_AMBULATORY_CARE_PROVIDER_SITE_OTHER): Payer: BLUE CROSS/BLUE SHIELD | Admitting: Rheumatology

## 2016-12-02 ENCOUNTER — Encounter: Payer: Self-pay | Admitting: Rheumatology

## 2016-12-02 VITALS — BP 102/72 | HR 78 | Resp 14 | Wt 100.0 lb

## 2016-12-02 DIAGNOSIS — R5383 Other fatigue: Secondary | ICD-10-CM | POA: Diagnosis not present

## 2016-12-02 DIAGNOSIS — R197 Diarrhea, unspecified: Secondary | ICD-10-CM

## 2016-12-02 DIAGNOSIS — R768 Other specified abnormal immunological findings in serum: Secondary | ICD-10-CM | POA: Diagnosis not present

## 2016-12-02 DIAGNOSIS — M79641 Pain in right hand: Secondary | ICD-10-CM

## 2016-12-02 DIAGNOSIS — R21 Rash and other nonspecific skin eruption: Secondary | ICD-10-CM | POA: Diagnosis not present

## 2016-12-02 DIAGNOSIS — M79642 Pain in left hand: Secondary | ICD-10-CM

## 2017-02-02 ENCOUNTER — Telehealth (INDEPENDENT_AMBULATORY_CARE_PROVIDER_SITE_OTHER): Payer: Self-pay | Admitting: Rheumatology

## 2017-02-02 NOTE — Telephone Encounter (Signed)
I called patient and advised copy of records are ready to pick up. °

## 2019-02-10 ENCOUNTER — Emergency Department (HOSPITAL_COMMUNITY): Payer: Self-pay

## 2019-02-10 ENCOUNTER — Emergency Department (HOSPITAL_COMMUNITY)
Admission: EM | Admit: 2019-02-10 | Discharge: 2019-02-11 | Disposition: A | Discharge status: Home / Self Care | Payer: Self-pay | Attending: Emergency Medicine | Admitting: Emergency Medicine

## 2019-02-10 ENCOUNTER — Encounter (HOSPITAL_COMMUNITY): Payer: Self-pay | Admitting: Emergency Medicine

## 2019-02-10 ENCOUNTER — Other Ambulatory Visit: Payer: Self-pay

## 2019-02-10 DIAGNOSIS — R0602 Shortness of breath: Secondary | ICD-10-CM | POA: Insufficient documentation

## 2019-02-10 DIAGNOSIS — T6594XA Toxic effect of unspecified substance, undetermined, initial encounter: Secondary | ICD-10-CM | POA: Insufficient documentation

## 2019-02-10 DIAGNOSIS — J683 Other acute and subacute respiratory conditions due to chemicals, gases, fumes and vapors: Secondary | ICD-10-CM

## 2019-02-10 DIAGNOSIS — R110 Nausea: Secondary | ICD-10-CM

## 2019-02-10 NOTE — ED Provider Notes (Signed)
[facility]-EMERGENCY DEPT Provider Note   CSN: [id number] Arrival date & time: 02/10/19  2229    History   Chief Complaint Chief Complaint  Patient presents with  . Cough  . Shortness of Breath    HPI Kayla Bradley is a 28 y.o. female a history of a positive ANA who presents to the emergency department with a chief complaint of shortness of breath.  Patient endorses waxing and waning shortness of breath with associated productive cough over the last 3 to 4 days.  Shortness of breath is worse with exertion.  She reports that she walked her dog earlier today and felt winded by the end of the walk, which she states is unusual for her.  She also reports that she has been feeling more fatigued.  Reports that she has had hypersomnia over the last few days.  Reports that she slept until 3 PM today, but only slept for a total of 9 hours last night as she was awake for several hours after she initially fell asleep and it took some time before she was able to return to sleep.  She reports that earlier today that she was feeling very nauseated.  No vomiting.  She also endorsed some upper abdominal pain that has since resolved.  Reports that she was having more frequent episodes of looser stool earlier today, but also reports that this is since resolved.  States that she is barely had an appetite and has had decreased p.o. intake over the last few days and has had to force herself to eat and drink.  Reports a longstanding history of GI upset.  Reports that abdominal pain and loose stools today were not unusual based on her history.  States that she has been feeling more hot and cold, but has not checked her temperature.  She reports that when she awoke this morning that she had broken out in a cold sweat.  No additional episodes.  She also endorses associated dizziness and lightheadedness.  Reports that she has been feeling more lightheaded with standing over the last few days.   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  She is not followed by cardiology.    She reports that her most recent menstrual cycle finished yesterday.  She reports her menstrual cycle was not more heavier than usual.  She reports that she smokes approximately 4 cigarettes daily.  She has occasional marijuana use.  Reports that she has not consumed alcohol in more than 2 months.  No other IV or recreational drug use.  States that she was [facility] 2 years [facility] after having a positive ANA and an allover rash.  Reports that she was treated with Keflex and the rash resolved, but they were concerned that she may have lupus.  Denies chest pain, palpitations, leg swelling, constipation, joint pain, numbness, weakness, URI symptoms, or back pain.  No known or suspected COVID-19 exposures.  Ports that she was seen by her dentist last week and was started on a muscle relaxer due to some jaw pain and clindamycin as she is preparing to have her wisdom teeth extracted.     The history is provided by the patient. No language interpreter was used.    History reviewed. No pertinent past medical history.  Patient Active Problem List   Diagnosis Date Noted  . ANA positive 11/22/2016  .  Other fatigue 11/22/2016  . Thoracic scoliosis 08/08/2012    Past Surgical History:  Procedure Laterality Date  . heart defect fixed       OB History   No obstetric history on file.      Home Medications    Prior to Admission medications   Medication Sig Start Date End Date Taking? Authorizing Provider  clindamycin (CLEOCIN) 300 MG capsule Take 300 mg by mouth 4 (four) times daily. 01/29/19  Yes [provider]  cyclobenzaprine (FLEXERIL) 10 MG tablet Take 10 mg by mouth 3 (three) times daily as needed for muscle spasms.   Yes [provider]  diphenhydrAMINE-PE-APAP (MUCINEX FAST-MAX COLD  FLU NGHT) 12.5-5-325 MG/10ML LIQD Take 20 mLs by mouth at bedtime as needed (sleep).   Yes [provider]    Family History Family History  Problem Relation Age of Onset  . Diabetes Father   . Heart disease Maternal Grandfather     Social History Social History   Tobacco Use  . Smoking status: Light Tobacco Smoker    Types: Cigarettes  . Smokeless tobacco: Never Used  Substance Use Topics  . Alcohol use: Yes    Alcohol/week: 1.0 standard drinks    Types: 1 Cans of beer per week  . Drug use: Yes    Frequency: 7.0 times per week    Types: Marijuana     Allergies   Escitalopram   Review of Systems Review of Systems  Constitutional: Positive for appetite change, chills, diaphoresis, fatigue and fever. Negative for activity change and unexpected weight change.  HENT: Positive for dental problem. Negative for congestion, sinus pressure, sinus pain and sore throat.   Eyes: Negative for pain, redness and visual disturbance.  Respiratory: Positive for cough and shortness of breath. Negative for wheezing.   Cardiovascular: Negative for chest pain, palpitations and leg swelling.  Gastrointestinal: Positive for abdominal pain and nausea. Negative for blood in stool, constipation and vomiting.  Endocrine: Negative for polyuria.  Genitourinary: Negative for dyspareunia, dysuria, flank pain, hematuria, urgency, vaginal bleeding and vaginal pain.  Musculoskeletal: Negative for back pain, gait problem, joint swelling, myalgias, neck pain and neck stiffness.  Skin: Negative for rash and wound.  Allergic/Immunologic: Negative for immunocompromised state.  Neurological: Positive for dizziness and light-headedness. Negative for seizures, syncope, weakness and headaches.  Psychiatric/Behavioral: Negative for confusion.     Physical Exam Updated Vital Signs BP 118/76   Pulse 84   Temp 99 F (37.2 C) (Oral)   Resp 17   Ht  (1.575 m)   Wt 56.7 kg   LMP 02/03/2019    SpO2 96%   BMI 22.86 kg/m   Physical Exam Vitals signs and nursing note reviewed.  Constitutional:      General: She is not in acute distress.    Appearance: She is not ill-appearing, toxic-appearing or diaphoretic.  HENT:     Head: Normocephalic.  Eyes:     Extraocular Movements: Extraocular movements intact.     Conjunctiva/sclera: Conjunctivae normal.     Pupils: Pupils are equal, round, and reactive to light.  Neck:     Musculoskeletal: Normal range of motion and neck supple.  Cardiovascular:     Rate and Rhythm: Normal rate and regular rhythm.     Heart sounds: No murmur. No friction rub. No gallop.   Pulmonary:     Effort: Pulmonary effort is normal. No respiratory distress.     Breath sounds: No stridor. No wheezing or rales.  Comments: Lungs are clear to auscultation bilaterally.  No increased work of breathing.  No conversational dyspnea.  Lungs are not diminished. Abdominal:     General: There is no distension.     Palpations: Abdomen is soft. There is no mass.     Tenderness: There is no abdominal tenderness. There is no right CVA tenderness, left CVA tenderness, guarding or rebound.     Hernia: No hernia is present.     Comments: Abdomen is soft, nondistended, nontender.  Skin:    General: Skin is warm.     Capillary Refill: Capillary refill takes less than 2 seconds.     Findings: No rash.  Neurological:     Mental Status: She is alert.  Psychiatric:        Behavior: Behavior normal.      ED Treatments / Results  Labs (all labs ordered are listed, but only abnormal results are displayed) Labs Reviewed  CBC WITH DIFFERENTIAL/PLATELET - Abnormal; Notable for the following components:      Result Value   WBC 11.5 (*)    Neutro Abs 8.9 (*)    All other components within normal limits  COMPREHENSIVE METABOLIC PANEL - Abnormal; Notable for the following components:   Glucose, Bld 111 (*)    Total Protein 8.6 (*)    Albumin 5.5 (*)    All other  components within normal limits  I-STAT BETA HCG BLOOD, ED ([facility], [facility], [facility] ONLY)    EKG None  Radiology Dg Chest Portable 1 View  Result Date: 02/10/2019 CLINICAL DATA:  28 year old female with cough. EXAM: PORTABLE CHEST 1 VIEW COMPARISON:  None. FINDINGS: The lungs are clear. There is no pleural effusion or pneumothorax. The cardiac silhouette is within normal limits. Upper mediastinal surgical clips, likely from ductal closure. No acute osseous pathology. IMPRESSION: No active disease. Electronically Signed   By: Elgie Collard M.D.   On: 02/10/2019 23:45    Procedures Procedures (including critical care time)  Medications Ordered in ED Medications  albuterol (VENTOLIN HFA) 108 (90 Base) MCG/ACT inhaler 8 puff (8 puffs Inhalation Given 02/11/19 0043)  aerochamber Z-Stat Plus/medium 1 each (1 each Other Given 02/11/19 0053)  ondansetron (ZOFRAN-ODT) disintegrating tablet 4 mg (4 mg Oral Given 02/11/19 0343)     Initial Impression / Assessment and Plan / ED Course  I have reviewed the triage vital signs and the nursing notes.  Pertinent labs & imaging results that were available during my care of the patient were reviewed by me and considered in my medical decision making (see chart for details).        28 year old female with a history of positive ANA presenting with multiple complaints over the last 3 to 4 days including fatigue, anorexia, nausea, abdominal pain, productive cough, shortness of breath, lightheadedness, dizziness, and hypersomnia.  Chest x-ray was unremarkable.  Will check basic labs, pregnancy test, and orthostatic vital signs and plan to reassess.  She is a tobacco user so given shortness of breath I will order an albuterol inhaler with a spacer and plan to reassess.  Oral temp is 99.  She has no tachycardia, hypoxia, tachypnea.  Normotensive.  Labs are reviewed and are reassuring.  Orthostatic vital signs are negative.  No obvious source of fatigue or anorexia at  this time.  Abdomen is soft and nontender.  She does not a surgical abdomen.  After treatment with albuterol inhaler, she reports significant improvement in her breathing.  She was ambulated in the ER without  hypoxia.  A question of the patient is having a component of reactive airway disease.  She reports her mother has a history of asthma.  The patient does also smoke approximately 4 cigarettes/day.  It is possible the patient could have an atypical presentation of COVID-19, but less likely.  At this time, the patient is hemodynamically stable and in no acute distress.  She is remained afebrile.  No hypoxia.  All questions have been answered.  I have advised the patient to follow-up with her PCP.  Safe for discharge home with outpatient follow-up at this time.  Final Clinical Impressions(s) / ED Diagnoses   Final diagnoses:  Reactive airways dysfunction syndrome [facility])  Nausea    ED Discharge Orders    None       Avir Deruiter A, PA-C 02/11/19 0745    Derwood Kaplan, MD 02/11/19 2330

## 2019-02-10 NOTE — ED Notes (Signed)
X-ray at bedside

## 2019-02-10 NOTE — ED Triage Notes (Signed)
Pt reports productive cough for the last several days along with decreased appetite. Pt reported increasing shortness of breath.

## 2019-02-11 LAB — CBC WITH DIFFERENTIAL/PLATELET
Abs Immature Granulocytes: 0.03 10*3/uL (ref 0.00–0.07)
Basophils Absolute: 0.1 10*3/uL (ref 0.0–0.1)
Basophils Relative: 1 %
Eosinophils Absolute: 0 10*3/uL (ref 0.0–0.5)
Eosinophils Relative: 0 %
HCT: 44.6 % (ref 36.0–46.0)
Hemoglobin: 14.7 g/dL (ref 12.0–15.0)
Immature Granulocytes: 0 %
Lymphocytes Relative: 18 %
Lymphs Abs: 2 10*3/uL (ref 0.7–4.0)
MCH: 30.8 pg (ref 26.0–34.0)
MCHC: 33 g/dL (ref 30.0–36.0)
MCV: 93.5 fL (ref 80.0–100.0)
Monocytes Absolute: 0.5 10*3/uL (ref 0.1–1.0)
Monocytes Relative: 4 %
Neutro Abs: 8.9 10*3/uL — ABNORMAL HIGH (ref 1.7–7.7)
Neutrophils Relative %: 77 %
Platelets: 340 10*3/uL (ref 150–400)
RBC: 4.77 MIL/uL (ref 3.87–5.11)
RDW: 12.5 % (ref 11.5–15.5)
WBC: 11.5 10*3/uL — ABNORMAL HIGH (ref 4.0–10.5)
nRBC: 0 % (ref 0.0–0.2)

## 2019-02-11 LAB — COMPREHENSIVE METABOLIC PANEL
ALT: 13 U/L (ref 0–44)
AST: 16 U/L (ref 15–41)
Albumin: 5.5 g/dL — ABNORMAL HIGH (ref 3.5–5.0)
Alkaline Phosphatase: 62 U/L (ref 38–126)
Anion gap: 11 (ref 5–15)
BUN: 12 mg/dL (ref 6–20)
CO2: 23 mmol/L (ref 22–32)
Calcium: 10.1 mg/dL (ref 8.9–10.3)
Chloride: 106 mmol/L (ref 98–111)
Creatinine, Ser: 0.79 mg/dL (ref 0.44–1.00)
GFR calc Af Amer: >60 mL/min (ref >60)
GFR calc non Af Amer: >60 mL/min (ref >60)
Glucose, Bld: 111 mg/dL — ABNORMAL HIGH (ref 70–99)
Potassium: 3.8 mmol/L (ref 3.5–5.1)
Sodium: 140 mmol/L (ref 135–145)
Total Bilirubin: 0.9 mg/dL (ref 0.3–1.2)
Total Protein: 8.6 g/dL — ABNORMAL HIGH (ref 6.5–8.1)

## 2019-02-11 LAB — I-STAT BETA HCG BLOOD, ED (MC, WL, AP ONLY): I-stat hCG, quantitative: <5 m[IU]/mL (ref <5)

## 2019-02-11 MED ORDER — ONDANSETRON 4 MG PO TBDP
4.0000 mg | ORAL_TABLET | Freq: Once | ORAL | Status: AC
Start: 2019-02-11 — End: 2019-02-11
  Administered 2019-02-11: 4 mg via ORAL
  Filled 2019-02-11: qty 1

## 2019-02-11 MED ORDER — ALBUTEROL SULFATE HFA 108 (90 BASE) MCG/ACT IN AERS
8.0000 | INHALATION_SPRAY | Freq: Once | RESPIRATORY_TRACT | Status: AC
  Administered 2019-02-11: 8 via RESPIRATORY_TRACT
  Filled 2019-02-11: qty 6.7

## 2019-02-11 MED ORDER — AEROCHAMBER Z-STAT PLUS/MEDIUM MISC
1.0000 | Freq: Once | Status: AC
  Administered 2019-02-11: 1
  Filled 2019-02-11: qty 1

## 2019-02-11 NOTE — Discharge Instructions (Signed)
Thank you for allowing me to care for you today in the Emergency Department.   Your work-up today in the ER was reassuring.  If you are feeling short of breath, you can use 2 puffs of the albuterol inhaler with a spacer every 4 hours as needed.  Try not to use this medication more than once every 4 hours due to side effects, which include feeling jittery and increased heart rate.  Use the prescription for nausea medication that was provided to you by your dentist.    If you continue to feel fatigued or have mild shortness of breath, follow-up with primary care.  If you do not have a primary care provider, call the number on your discharge paperwork to get established with one.  Return to the emergency department if you develop persistent vomiting despite taking nausea medication, respiratory distress, shortness of breath with a high fever, chest pain, if your lips or fingers turn blue, if you pass out, or other new, concerning symptoms.

## 2019-02-11 NOTE — ED Notes (Addendum)
Ambulated pt 20 meters and O2 sat remained between 97-99%. Pt said she feels much better and can breathe more clearly since using inhaler.

## 2019-02-12 NOTE — ED Notes (Signed)
Patient stated she had a missed call from hospital number-informed patient to wait for another call-follow up with health department or wellness center

## 2019-07-25 ENCOUNTER — Encounter (HOSPITAL_COMMUNITY): Payer: Self-pay | Admitting: *Deleted

## 2019-07-25 ENCOUNTER — Other Ambulatory Visit: Payer: Self-pay

## 2019-07-25 ENCOUNTER — Emergency Department (HOSPITAL_COMMUNITY)
Admission: EM | Admit: 2019-07-25 | Discharge: 2019-07-25 | Disposition: A | Discharge status: Home / Self Care | Payer: Self-pay | Attending: Emergency Medicine | Admitting: Emergency Medicine

## 2019-07-25 DIAGNOSIS — F17210 Nicotine dependence, cigarettes, uncomplicated: Secondary | ICD-10-CM | POA: Insufficient documentation

## 2019-07-25 DIAGNOSIS — K029 Dental caries, unspecified: Secondary | ICD-10-CM | POA: Insufficient documentation

## 2019-07-25 MED ORDER — PENICILLIN V POTASSIUM 500 MG PO TABS: 500.0000 mg | ORAL_TABLET | Freq: Four times a day (QID) | ORAL | 0 refills | Status: AC

## 2019-07-25 NOTE — Discharge Instructions (Signed)
Please read instructions below. Take the antibiotic, Penicillin V, 4 times per day until they are gone. You can take tylenol every 4-6 hours as needed for pain. Schedule an appointment with a dentist, using the dental resource guide attached. Return to the ER for difficulty swallowing or breathing, fever, or new or worsening symptoms.

## 2019-07-25 NOTE — ED Notes (Signed)
Patient verbalizes understanding of discharge instructions. Opportunity for questioning and answers were provided. Armband removed by staff, pt discharged from ED ambulatory.   

## 2019-07-25 NOTE — ED Triage Notes (Signed)
Pt states that she has pain in left upper and lower jaw, pt is aware that she has dental problems there but states that she is pregnant and the health department is doing her prenatal care.  LMP was in July.

## 2019-07-25 NOTE — ED Notes (Signed)
Dental box at bedside. 

## 2019-07-25 NOTE — ED Provider Notes (Signed)
[facility] EMERGENCY DEPARTMENT Provider Note   CSN: [id number] Arrival date & time: 07/25/19  1608     History   Chief Complaint Chief Complaint  Patient presents with  . Dental Pain    HPI Kayla Bradley is a 28 y.o. female presenting to the ED for  gradual onset left upper and lower dental pain.  She states her symptoms have been coming and going for some time, however worsened over the last couple of days.  She thinks it is due to her wisdom teeth.  She states that she has an appointment with a dentist for early November however does not think she can wait that long.  she states that she has taken 1 dose of amoxicillin that she had left over.  She has not taken anything for the pain.  Patient denies fever, nausea, and vomiting.  Denies difficulty breathing or swallowing.  Of note, she is currently about 3 months pregnant but has not had an ultrasound yet.  She is currently followed by the health department and has had multiple prenatal visits.      The history is provided by the patient.    History reviewed. No pertinent past medical history.  Patient Active Problem List   Diagnosis Date Noted  . ANA positive 11/22/2016  . Other fatigue 11/22/2016  . Thoracic scoliosis 08/08/2012    Past Surgical History:  Procedure Laterality Date  . heart defect fixed       OB History   No obstetric history on file.      Home Medications    Prior to Admission medications   Medication Sig Start Date End Date Taking? Authorizing Provider  clindamycin (CLEOCIN) 300 MG capsule Take 300 mg by mouth 4 (four) times daily. 01/29/19   [provider]  cyclobenzaprine (FLEXERIL) 10 MG tablet Take 10 mg by mouth 3 (three) times daily as needed for muscle spasms.    [provider]  diphenhydrAMINE-PE-APAP (MUCINEX FAST-MAX COLD FLU NGHT) 12.5-5-325 MG/10ML LIQD Take 20 mLs by mouth at bedtime as needed (sleep).    [provider]   penicillin v potassium (VEETID) 500 MG tablet Take 1 tablet (500 mg total) by mouth 4 (four) times daily for 7 days. 07/25/19 08/01/19  Robinson, [place] N, PA-C    Family History Family History  Problem Relation Age of Onset  . Diabetes Father   . Heart disease Maternal Grandfather     Social History Social History   Tobacco Use  . Smoking status: Light Tobacco Smoker    Types: Cigarettes  . Smokeless tobacco: Never Used  Substance Use Topics  . Alcohol use: Not Currently    Alcohol/week: 1.0 standard drinks    Types: 1 Cans of beer per week  . Drug use: Not Currently    Frequency: 7.0 times per week    Types: Marijuana     Allergies   Escitalopram   Review of Systems Review of Systems  Constitutional: Negative for fever.  HENT: Positive for dental problem. Negative for trouble swallowing.   Respiratory: Negative for stridor.      Physical Exam Updated Vital Signs BP (!) 141/85 (BP Location: Right Arm)   Pulse 97   Temp 99.2 F (37.3 C) (Oral)   Resp 16   Wt 63.5 kg   SpO2 98%   BMI 25.61 kg/m   Physical Exam Vitals signs and nursing note reviewed.  Constitutional:      General: She is not in  acute distress.    Appearance: She is well-developed. She is not ill-appearing.  HENT:     Head: Normocephalic and atraumatic.     Mouth/Throat:      Comments: Left upper last 2 molars with decay and tenderness.  Left lower last molar with decay and tenderness.  No gingival fluctuance.  No sublingual edema or tenderness.  Uvula is midline, tolerating secretions, no trismus. Eyes:     Conjunctiva/sclera: Conjunctivae normal.  Cardiovascular:     Rate and Rhythm: Normal rate.  Pulmonary:     Effort: Pulmonary effort is normal.  Neurological:     Mental Status: She is alert.  Psychiatric:        Mood and Affect: Mood normal.        Behavior: Behavior normal.      ED Treatments / Results  Labs (all labs ordered are listed, but only abnormal results are  displayed) Labs Reviewed - No data to display  EKG None  Radiology No results found.  Procedures Procedures (including critical care time)  Medications Ordered in ED Medications - No data to display   Initial Impression / Assessment and Plan / ED Course  I have reviewed the triage vital signs and the nursing notes.  Pertinent labs & imaging results that were available during my care of the patient were reviewed by me and considered in my medical decision making (see chart for details).        Patient with dental caries.  No gross abscess.  VSS, afebrile, tolerating secretions. Exam unconcerning for peritonsillar abscess, Ludwig's angina or spread of infection.  Will treat with penicillin and Tylenol.  Counseled against use of NSAIDs during pregnancy.  Patient requesting additional resources, will provide dental resource guide and referral to [facility] outpatient women's clinic.  Discussed return precautions.  Pt safe for discharge.  Discussed results, findings, treatment and follow up. Patient advised of return precautions. Patient verbalized understanding and agreed with plan.  Final Clinical Impressions(s) / ED Diagnoses   Final diagnoses:  Pain due to dental caries    ED Discharge Orders         Ordered    penicillin v potassium (VEETID) 500 MG tablet  4 times daily     07/25/19 2003           Robinson, [place] N, PA-C 07/25/19 2004    Varney Biles, MD 07/26/19 [phone]

## 2019-12-24 DIAGNOSIS — Z9889 Other specified postprocedural states: Secondary | ICD-10-CM | POA: Insufficient documentation

## 2019-12-24 DIAGNOSIS — R0601 Orthopnea: Secondary | ICD-10-CM | POA: Insufficient documentation

## 2020-02-04 DIAGNOSIS — Z3A40 40 weeks gestation of pregnancy: Secondary | ICD-10-CM | POA: Diagnosis not present

## 2020-02-04 DIAGNOSIS — O3663X0 Maternal care for excessive fetal growth, third trimester, not applicable or unspecified: Secondary | ICD-10-CM | POA: Diagnosis not present

## 2020-02-04 DIAGNOSIS — O3663X1 Maternal care for excessive fetal growth, third trimester, fetus 1: Secondary | ICD-10-CM | POA: Diagnosis not present

## 2020-02-06 DIAGNOSIS — Z3A00 Weeks of gestation of pregnancy not specified: Secondary | ICD-10-CM | POA: Diagnosis not present

## 2020-02-06 DIAGNOSIS — O9081 Anemia of the puerperium: Secondary | ICD-10-CM | POA: Diagnosis not present

## 2020-06-24 IMAGING — DX PORTABLE CHEST - 1 VIEW
1 series · 1 of 1 positions shown · non-contrast
Comparison: None.

CLINICAL DATA: 27-year-old female with cough.

EXAM:
PORTABLE CHEST 1 VIEW

[chest ap]
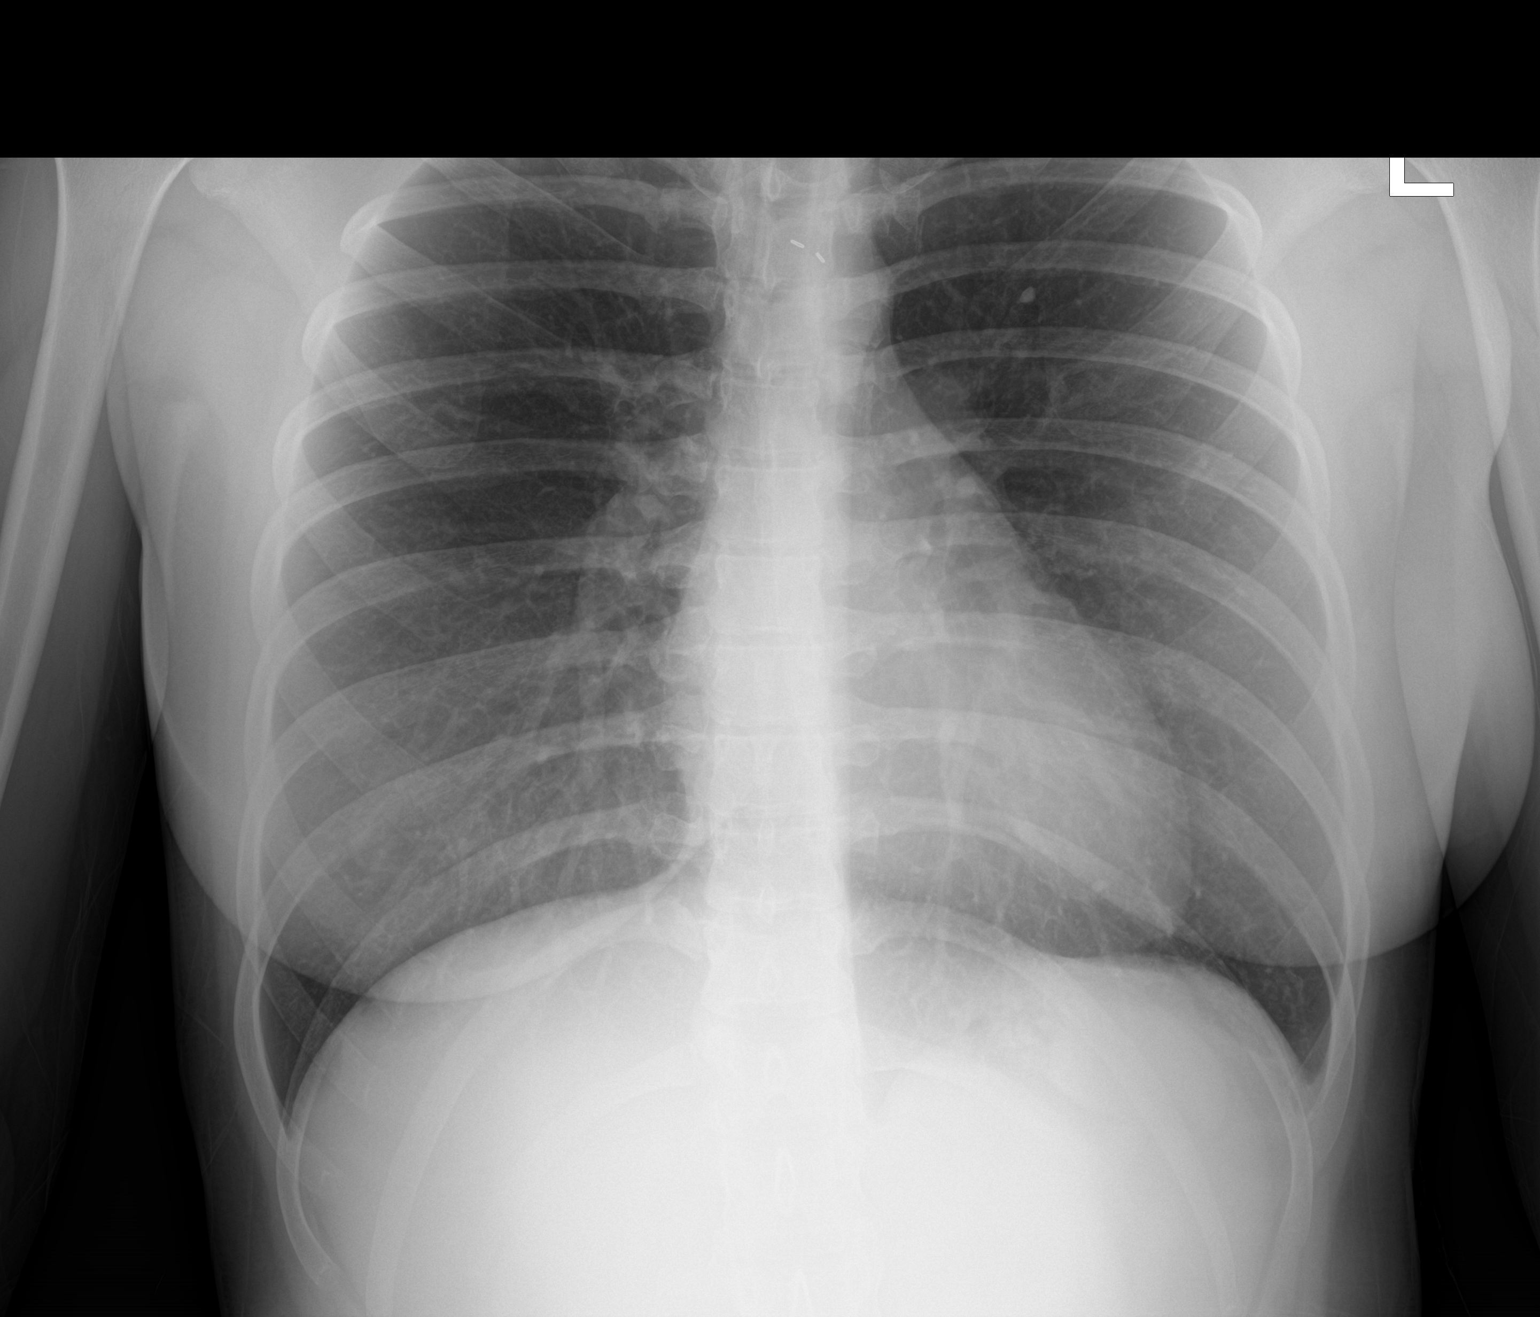

[1 of 1 positions shown; findings below may reference images not displayed]

FINDINGS: The lungs are clear. There is no pleural effusion or pneumothorax.
The cardiac silhouette is within normal limits. Upper mediastinal
surgical clips, likely from ductal closure. No acute osseous
pathology.
IMPRESSION: No active disease.

## 2020-07-31 ENCOUNTER — Encounter: Payer: Self-pay | Admitting: Registered Nurse

## 2020-07-31 ENCOUNTER — Other Ambulatory Visit: Payer: Self-pay

## 2020-07-31 ENCOUNTER — Ambulatory Visit (INDEPENDENT_AMBULATORY_CARE_PROVIDER_SITE_OTHER): Payer: Medicaid Other | Admitting: Registered Nurse

## 2020-07-31 VITALS — BP 122/82 | HR 87 | Temp 98.6°F | Resp 18 | Ht 62.0 in | Wt 167.0 lb

## 2020-07-31 DIAGNOSIS — Z130 Encounter for screening for diseases of the blood and blood-forming organs and certain disorders involving the immune mechanism: Secondary | ICD-10-CM

## 2020-07-31 DIAGNOSIS — Z1329 Encounter for screening for other suspected endocrine disorder: Secondary | ICD-10-CM | POA: Diagnosis not present

## 2020-07-31 DIAGNOSIS — Z13220 Encounter for screening for lipoid disorders: Secondary | ICD-10-CM

## 2020-07-31 DIAGNOSIS — Z7689 Persons encountering health services in other specified circumstances: Secondary | ICD-10-CM

## 2020-07-31 DIAGNOSIS — Z13228 Encounter for screening for other metabolic disorders: Secondary | ICD-10-CM

## 2020-07-31 DIAGNOSIS — I8393 Asymptomatic varicose veins of bilateral lower extremities: Secondary | ICD-10-CM | POA: Diagnosis not present

## 2020-07-31 DIAGNOSIS — Z0000 Encounter for general adult medical examination without abnormal findings: Secondary | ICD-10-CM

## 2020-07-31 NOTE — Patient Instructions (Signed)
° ° ° °  If you have lab work done today you will be contacted with your lab results within the next 2 weeks.  If you have not heard from us then please contact us. The fastest way to get your results is to register for My Chart. ° ° °IF you received an x-ray today, you will receive an invoice from [facility]. Please contact [facility] at [phone] with questions or concerns regarding your invoice.  ° °IF you received labwork today, you will receive an invoice from [facility]. Please contact [facility] at [phone] with questions or concerns regarding your invoice.  ° °Our billing staff will not be able to assist you with questions regarding bills from these companies. ° °You will be contacted with the lab results as soon as they are available. The fastest way to get your results is to activate your My Chart account. Instructions are located on the last page of this paperwork. If you have not heard from us regarding the results in 2 weeks, please contact this office. °  ° ° ° °

## 2020-08-01 LAB — COMPREHENSIVE METABOLIC PANEL
ALT: 13 IU/L (ref 0–32)
AST: 13 IU/L (ref 0–40)
Albumin/Globulin Ratio: 2.3 — ABNORMAL HIGH (ref 1.2–2.2)
Albumin: 4.8 g/dL (ref 3.9–5.0)
Alkaline Phosphatase: 88 IU/L (ref 44–121)
BUN/Creatinine Ratio: 13 (ref 9–23)
BUN: 8 mg/dL (ref 6–20)
Bilirubin Total: 0.3 mg/dL (ref 0.0–1.2)
CO2: 19 mmol/L — ABNORMAL LOW (ref 20–29)
Calcium: 9.1 mg/dL (ref 8.7–10.2)
Chloride: 105 mmol/L (ref 96–106)
Creatinine, Ser: 0.6 mg/dL (ref 0.57–1.00)
GFR calc Af Amer: 143 mL/min/{1.73_m2} (ref >59)
GFR calc non Af Amer: 124 mL/min/{1.73_m2} (ref >59)
Globulin, Total: 2.1 g/dL (ref 1.5–4.5)
Glucose: 95 mg/dL (ref 65–99)
Potassium: 4.3 mmol/L (ref 3.5–5.2)
Sodium: 138 mmol/L (ref 134–144)
Total Protein: 6.9 g/dL (ref 6.0–8.5)

## 2020-08-01 LAB — CBC WITH DIFFERENTIAL/PLATELET
Basophils Absolute: 0.1 10*3/uL (ref 0.0–0.2)
Basos: 1 %
EOS (ABSOLUTE): 0.1 10*3/uL (ref 0.0–0.4)
Eos: 1 %
Hematocrit: 39.5 % (ref 34.0–46.6)
Hemoglobin: 12.9 g/dL (ref 11.1–15.9)
Immature Grans (Abs): 0 10*3/uL (ref 0.0–0.1)
Immature Granulocytes: 0 %
Lymphocytes Absolute: 2.1 10*3/uL (ref 0.7–3.1)
Lymphs: 26 %
MCH: 28 pg (ref 26.6–33.0)
MCHC: 32.7 g/dL (ref 31.5–35.7)
MCV: 86 fL (ref 79–97)
Monocytes Absolute: 0.4 10*3/uL (ref 0.1–0.9)
Monocytes: 4 %
Neutrophils Absolute: 5.6 10*3/uL (ref 1.4–7.0)
Neutrophils: 68 %
Platelets: 358 10*3/uL (ref 150–450)
RBC: 4.6 x10E6/uL (ref 3.77–5.28)
RDW: 13 % (ref 11.7–15.4)
WBC: 8.2 10*3/uL (ref 3.4–10.8)

## 2020-08-01 LAB — LIPID PANEL
Chol/HDL Ratio: 5.5 ratio — ABNORMAL HIGH (ref 0.0–4.4)
Cholesterol, Total: 183 mg/dL (ref 100–199)
HDL: 33 mg/dL — ABNORMAL LOW (ref >39)
LDL Chol Calc (NIH): 120 mg/dL — ABNORMAL HIGH (ref 0–99)
Triglycerides: 168 mg/dL — ABNORMAL HIGH (ref 0–149)
VLDL Cholesterol Cal: 30 mg/dL (ref 5–40)

## 2020-08-01 LAB — TSH: TSH: 1.81 u[IU]/mL (ref 0.450–4.500)

## 2020-08-01 LAB — HEMOGLOBIN A1C
Est. average glucose Bld gHb Est-mCnc: 105 mg/dL
Hgb A1c MFr Bld: 5.3 % (ref 4.8–5.6)

## 2020-08-10 ENCOUNTER — Encounter: Payer: Self-pay | Admitting: Registered Nurse

## 2020-08-10 DIAGNOSIS — I8393 Asymptomatic varicose veins of bilateral lower extremities: Secondary | ICD-10-CM | POA: Insufficient documentation

## 2020-08-10 NOTE — Progress Notes (Signed)
New Patient Office Visit  Subjective:  Patient ID: Kayla Bradley, female    DOB: [birth year]  Age: 29 y.o. MRN: [id number]  CC:  Chief Complaint  Patient presents with   New Patient (Initial Visit)    Patient states she is here to Establish care. Patient would like to discuss lumps on right legs that appeared on 04/05/2020 and has not gone away yet. she states that they are soft and its no discomfort at all.    HPI Kayla Bradley presents for visit to est care.  Concerned for bumps on legs. Appeared in pregnancy. Have not reduced Soft, nontender, feel fluid No abnormal coloration No hx of clot Does note she did have postpartum hemorrhage  No redness or swelling of legs on a larger scale. No pain or sciatica.  Otherwise feels well. Histories reviewed and updated as warranted  Pt interested in routine labs.   Past Medical History:  Diagnosis Date   Dental infection    Postpartum hemorrhage     Past Surgical History:  Procedure Laterality Date   CESAREAN SECTION     heart defect fixed      Family History  Problem Relation Age of Onset   Diabetes Father    Heart disease Maternal Grandfather     Social History   Socioeconomic History   Marital status: Married    Spouse name: Not on file   Number of children: 1   Years of education: Not on file   Highest education level: Not on file  Occupational History   Occupation: Host    Comment: Landscape architect  Tobacco Use   Smoking status: Former Smoker    Types: Cigarettes   Smokeless tobacco: Never Used  Building services engineer Use: Never used  Substance and Sexual Activity   Alcohol use: Not Currently    Alcohol/week: 1.0 standard drink    Types: 1 Cans of beer per week   Drug use: Not Currently    Frequency: 7.0 times per week    Types: Marijuana   Sexual activity: Yes    Birth control/protection: Condom  Other Topics Concern   Not on file  Social History Narrative   Not on  file   Social Determinants of Health   Financial Resource Strain:    Difficulty of Paying Living Expenses: Not on file  Food Insecurity:    Worried About Programme researcher, broadcasting/film/video in the Last Year: Not on file   [employer] of Food in the Last Year: Not on file  Transportation Needs:    Lack of Transportation (Medical): Not on file   Lack of Transportation (Non-Medical): Not on file  Physical Activity:    Days of Exercise per Week: Not on file   Minutes of Exercise per Session: Not on file  Stress:    Feeling of Stress : Not on file  Social Connections:    Frequency of Communication with Friends and Family: Not on file   Frequency of Social Gatherings with Friends and Family: Not on file   Attends Religious Services: Not on file   Active Member of Clubs or Organizations: Not on file   Attends Banker Meetings: Not on file   Marital Status: Not on file  Intimate Partner Violence:    Fear of Current or Ex-Partner: Not on file   Emotionally Abused: Not on file   Physically Abused: Not on file   Sexually Abused: Not on file    ROS  Review of Systems  Constitutional: Negative.   HENT: Negative.   Eyes: Negative.   Respiratory: Negative.   Cardiovascular: Negative.   Gastrointestinal: Negative.   Genitourinary: Negative.   Musculoskeletal: Negative.   Skin: Negative.   Neurological: Negative.   Psychiatric/Behavioral: Negative.     Objective:   Today's Vitals: BP 122/82    Pulse 87    Temp 98.6 F (37 C) (Temporal)    Resp 18    Ht 5\' 2"  (1.575 m)    Wt 167 lb (75.8 kg)    LMP 07/04/2020    SpO2 97%    BMI 30.54 kg/m   Physical Exam Vitals and nursing note reviewed.  Constitutional:      General: She is not in acute distress.    Appearance: Normal appearance. She is normal weight. She is not ill-appearing, toxic-appearing or diaphoretic.  Cardiovascular:     Rate and Rhythm: Normal rate and regular rhythm.     Heart sounds: Normal heart sounds.   Pulmonary:     Effort: Pulmonary effort is normal. No respiratory distress.     Breath sounds: Normal breath sounds.  Skin:    General: Skin is warm and dry.     Capillary Refill: Capillary refill takes less than 2 seconds. Appears as varicosities on lower extremities. Soft, fluid, nontender.     Coloration: Skin is not jaundiced or pale.     Findings: No bruising, erythema, lesion or rash.  Neurological:     General: No focal deficit present.     Mental Status: She is alert and oriented to person, place, and time. Mental status is at baseline.  Psychiatric:        Mood and Affect: Mood normal.        Behavior: Behavior normal.        Thought Content: Thought content normal.        Judgment: Judgment normal.     Assessment & Plan:   Problem List Items Addressed This Visit    None    Visit Diagnoses    Routine general medical examination at a health care facility    -  Primary   Relevant Orders   Comprehensive metabolic panel (Completed)   CBC with Differential (Completed)   TSH (Completed)   Lipid panel (Completed)   Hemoglobin A1c (Completed)      Outpatient Encounter Medications as of 07/31/2020  Medication Sig   clindamycin (CLEOCIN) 300 MG capsule Take 300 mg by mouth 4 (four) times daily. (Patient not taking: Reported on 07/31/2020)   cyclobenzaprine (FLEXERIL) 10 MG tablet Take 10 mg by mouth 3 (three) times daily as needed for muscle spasms. (Patient not taking: Reported on 07/31/2020)   diphenhydrAMINE-PE-APAP (MUCINEX FAST-MAX COLD FLU NGHT) 12.5-5-325 MG/10ML LIQD Take 20 mLs by mouth at bedtime as needed (sleep). (Patient not taking: Reported on 07/31/2020)   No facility-administered encounter medications on file as of 07/31/2020.    Follow-up: No follow-ups on file.   PLAN  Discussed with patient that these varicosities are likely result of pregnancy and do not appear to pose any serious threat. Pt relieved. Will contact clinic if she will want a  referral to vascular  Monitor for any sxs of dvt - reviewed symptoms - pt voiced understanding  Return annually  Labs collected, will follow up as warranted  Patient encouraged to call clinic with any questions, comments, or concerns.  08/02/2020, NP

## 2020-10-20 ENCOUNTER — Other Ambulatory Visit: Payer: Self-pay

## 2020-10-20 ENCOUNTER — Ambulatory Visit (INDEPENDENT_AMBULATORY_CARE_PROVIDER_SITE_OTHER): Payer: Medicaid Other

## 2020-10-20 VITALS — BP 131/70 | HR 85 | Wt 167.0 lb

## 2020-10-20 DIAGNOSIS — Z3201 Encounter for pregnancy test, result positive: Secondary | ICD-10-CM | POA: Diagnosis not present

## 2020-10-20 LAB — POCT URINE PREGNANCY: Preg Test, Ur: POSITIVE — AB

## 2020-10-20 NOTE — Progress Notes (Addendum)
Ms. Barile presents today for UPT. She has no unusual complaints.  LMP:08/27/2020    OBJECTIVE: Appears well, in no apparent distress.  OB History    Gravida  2   Para  1   Term  1   Preterm      AB      Living  1     SAB      IAB      Ectopic      Multiple      Live Births  1          Home UPT Result:POSITIVE X 2 In-Office UPT result:POSITIVE  I have reviewed the patient's medical, obstetrical, social, and family histories, and medications.   ASSESSMENT: Positive pregnancy test LMP 08/27/2020 EDD 06/03/2021 [place]    [redacted]w[redacted]d  PLAN Prenatal care to be completed at: [facility]    Chart reviewed for nurse visit. Agree with plan of care.   Currie Paris, NP 10/20/2020 4:14 PM

## 2020-11-08 ENCOUNTER — Encounter (HOSPITAL_COMMUNITY): Payer: Self-pay | Admitting: Emergency Medicine

## 2020-11-08 ENCOUNTER — Other Ambulatory Visit: Payer: Self-pay

## 2020-11-08 ENCOUNTER — Emergency Department (HOSPITAL_COMMUNITY)
Admission: EM | Admit: 2020-11-08 | Discharge: 2020-11-09 | Disposition: A | Discharge status: Home / Self Care | Payer: Medicaid Other | Attending: Emergency Medicine | Admitting: Emergency Medicine

## 2020-11-08 DIAGNOSIS — Z87891 Personal history of nicotine dependence: Secondary | ICD-10-CM | POA: Diagnosis not present

## 2020-11-08 DIAGNOSIS — O208 Other hemorrhage in early pregnancy: Secondary | ICD-10-CM | POA: Insufficient documentation

## 2020-11-08 DIAGNOSIS — Z3A08 8 weeks gestation of pregnancy: Secondary | ICD-10-CM

## 2020-11-08 DIAGNOSIS — K625 Hemorrhage of anus and rectum: Secondary | ICD-10-CM | POA: Insufficient documentation

## 2020-11-08 DIAGNOSIS — O99611 Diseases of the digestive system complicating pregnancy, first trimester: Secondary | ICD-10-CM | POA: Diagnosis not present

## 2020-11-08 NOTE — ED Triage Notes (Addendum)
Pt reports bright red blood in the toilet while having a BM today. Reports some R groin pain that she attributes to "things stretching." No pain right now. Denies dysuria or vaginal bleeding/ discharge. No obvious distress. [redacted] weeks pregnant.

## 2020-11-09 LAB — WET PREP, GENITAL
Clue Cells Wet Prep HPF POC: NONE SEEN
Sperm: NONE SEEN
Trich, Wet Prep: NONE SEEN
Yeast Wet Prep HPF POC: NONE SEEN

## 2020-11-09 LAB — GC/CHLAMYDIA PROBE AMP (~~LOC~~) NOT AT ARMC
Chlamydia: NEGATIVE
Comment: NEGATIVE
Comment: NORMAL
Neisseria Gonorrhea: NEGATIVE

## 2020-11-09 LAB — POC OCCULT BLOOD, ED: Fecal Occult Bld: NEGATIVE

## 2020-11-09 NOTE — ED Provider Notes (Signed)
[facility]-EMERGENCY DEPT Provider Note   CSN: [id number] Arrival date & time: 11/08/20  2124     History Chief Complaint  Patient presents with  . Rectal Bleeding    Kayla Bradley is a 30 y.o. female.  The history is provided by the patient and the spouse.  Rectal Bleeding Quality:  Bright red Amount:  Moderate Timing:  Constant Chronicity:  New Similar prior episodes: no   Relieved by:  None tried Worsened by:  Nothing Associated symptoms: no abdominal pain, no dizziness, no fever, no hematemesis, no light-headedness, no loss of consciousness and no vomiting   Risk factors: no NSAID use   Patient reports she is approximately [redacted] weeks pregnant. She is unsure of her last menstrual period. But thinks it was around Thanksgiving She has had a positive pregnancy test for her OB, but no other testing or imaging. She reports earlier tonight she was having a bowel movement and noticed bright red blood in the stool. No melena. No vomiting. Denies any rectal pain. She is chronically constipated. She is unsure if she has had any vaginal bleeding. No new abdominal pain. She reports since her pregnancy, she has had mild stretching in her right lower abdomen. This is not new today She does not take chronic NSAIDs     Past Medical History:  Diagnosis Date  . Dental infection   . Dyspnea   . Postpartum hemorrhage     Patient Active Problem List   Diagnosis Date Noted  . Asymptomatic varicose veins of both lower extremities 08/10/2020  . ANA positive 11/22/2016  . Other fatigue 11/22/2016  . Thoracic scoliosis 08/08/2012    Past Surgical History:  Procedure Laterality Date  . CESAREAN SECTION    . heart defect fixed       OB History    Gravida  2   Para  1   Term  1   Preterm      AB      Living  1     SAB      IAB      Ectopic      Multiple      Live Births  1           Family History  Problem Relation Age of Onset  .  Diabetes Father   . Diabetes Sister   . Heart disease Maternal Grandfather     Social History   Tobacco Use  . Smoking status: Former Smoker    Types: Cigarettes  . Smokeless tobacco: Never Used  Vaping Use  . Vaping Use: Never used  Substance Use Topics  . Alcohol use: Not Currently    Alcohol/week: 1.0 standard drink    Types: 1 Cans of beer per week  . Drug use: Not Currently    Frequency: 7.0 times per week    Types: Marijuana    Home Medications Prior to Admission medications   Medication Sig Start Date End Date Taking? Authorizing Provider  clindamycin (CLEOCIN) 300 MG capsule Take 300 mg by mouth 4 (four) times daily. Patient not taking: Reported on 07/31/2020 01/29/19   [provider]  cyclobenzaprine (FLEXERIL) 10 MG tablet Take 10 mg by mouth 3 (three) times daily as needed for muscle spasms. Patient not taking: Reported on 07/31/2020    [provider]  diphenhydrAMINE-PE-APAP (MUCINEX FAST-MAX COLD FLU NGHT) 12.5-5-325 MG/10ML LIQD Take 20 mLs by mouth at bedtime as needed (sleep). Patient not taking: Reported on 07/31/2020  [provider]    Allergies    Escitalopram  Review of Systems   Review of Systems  Constitutional: Negative for fever.  Gastrointestinal: Positive for anal bleeding, blood in stool, constipation and hematochezia. Negative for abdominal pain, diarrhea, hematemesis, rectal pain and vomiting.  Genitourinary: Negative for dysuria, vaginal bleeding and vaginal discharge.  Neurological: Negative for dizziness, loss of consciousness and light-headedness.  All other systems reviewed and are negative.   Physical Exam Updated Vital Signs BP 140/76 (BP Location: Right Arm)   Pulse 79   Temp 98 F (36.7 C) (Oral)   Resp 16   Ht 1.6 m (5\' 3" )   Wt 75.8 kg   LMP 08/27/2020 (Approximate)   SpO2 98%   BMI 29.58 kg/m   Physical Exam CONSTITUTIONAL: Well developed/well nourished HEAD:  Normocephalic/atraumatic EYES: EOMI/PERRL, conjunctive are pink ENMT: Mucous membranes moist NECK: supple no meningeal signs SPINE/BACK:entire spine nontender CV: S1/S2 noted, no murmurs/rubs/gallops noted LUNGS: Lungs are clear to auscultation bilaterally, no apparent distress ABDOMEN: soft, nontender, no rebound or guarding, bowel sounds noted throughout abdomen GU:no cva tenderness No CMT. No vaginal bleeding. Small whitish discharge noted. No vaginal lacerations. No pooling of fluid. Female chaperone present Rectal-1 small external hemorrhoid. No blood or melena. No rectal masses or abscesses. No lacerations or fissures. Female chaperone present Husband was present at patient request NEURO: Pt is awake/alert/appropriate, moves all extremitiesx4.  No facial droop.   EXTREMITIES: pulses normal/equal, full ROM SKIN: warm, color normal PSYCH: no abnormalities of mood noted, alert and oriented to situation  ED Results / Procedures / Treatments   Labs (all labs ordered are listed, but only abnormal results are displayed) Labs Reviewed  WET PREP, GENITAL  POC OCCULT BLOOD, ED  GC/CHLAMYDIA PROBE AMP ([place]) NOT AT [facility]    EKG None  Radiology No results found.  Procedures Ultrasound ED OB Pelvic  Date/Time: 11/09/2020 12:07 AM Performed by: 01/07/2021, MD Authorized by: Zadie Rhine, MD   Procedure details:    Indications: evaluate for IUP     Assess:  Intrauterine pregnancy   Technique:  Transabdominal obstetric (HCG+) exam   Images: archived   Study Limitations: body habitus and patient compliance Uterine findings:    Intrauterine pregnancy: identified     Single gestation: identified     Gestational sac: identified     Yolk sac: identified     Fetal heart rate: identified     Estimated gestational age: 42 weeks Left ovary findings:    Left ovary:  Not visualized    Right ovary findings:     Right ovary:  Not visualized        Medications  Ordered in ED Medications - No data to display  ED Course  I have reviewed the triage vital signs and the nursing notes.  Pertinent labs results that were available during my care of the patient were reviewed by me and considered in my medical decision making (see chart for details).    MDM Rules/Calculators/A&P                          Patient is very well-appearing She had 1 isolated episode of rectal bleeding. No melena or blood noted on rectal exam. Hemoccult negative. Suspect she may have internal hemorrhoids. Patient was unsure if she had any vaginal bleeding. Pelvic was performed and no vaginal bleeding was noted. Limited bedside ultrasound was performed about calculations she is approximately 8-9 weeks.  Fetal heart rate was noted as well as a yolk sac. Images were archived Advised patient that she needs to have close follow-up with OB/GYN. Advised the limitations of bedside ultrasounding. Low suspicion for significant GI bleed at this time. No change in orthostatic vital signs. Final Clinical Impression(s) / ED Diagnoses Final diagnoses:  Rectal bleeding  [redacted] weeks gestation of pregnancy    Rx / DC Orders ED Discharge Orders    None       Zadie Rhine, MD 11/09/20 [phone]

## 2020-11-10 ENCOUNTER — Telehealth: Payer: Self-pay | Admitting: *Deleted

## 2020-11-10 NOTE — Telephone Encounter (Signed)
Transition Care Management Unsuccessful Follow-up Telephone Call  Date of discharge and from where:  11/09/2020 - [facility]  Attempts:  1st Attempt  Reason for unsuccessful TCM follow-up call:  Voice mail full

## 2020-11-11 ENCOUNTER — Other Ambulatory Visit: Payer: Self-pay

## 2020-11-11 ENCOUNTER — Ambulatory Visit (INDEPENDENT_AMBULATORY_CARE_PROVIDER_SITE_OTHER): Payer: Medicaid Other

## 2020-11-11 ENCOUNTER — Ambulatory Visit: Payer: Medicaid Other

## 2020-11-11 VITALS — BP 126/83 | HR 82 | Ht 63.0 in | Wt 171.0 lb

## 2020-11-11 DIAGNOSIS — Z789 Other specified health status: Secondary | ICD-10-CM | POA: Diagnosis not present

## 2020-11-11 DIAGNOSIS — O3680X0 Pregnancy with inconclusive fetal viability, not applicable or unspecified: Secondary | ICD-10-CM

## 2020-11-11 DIAGNOSIS — O0993 Supervision of high risk pregnancy, unspecified, third trimester: Secondary | ICD-10-CM | POA: Insufficient documentation

## 2020-11-11 DIAGNOSIS — Z3491 Encounter for supervision of normal pregnancy, unspecified, first trimester: Secondary | ICD-10-CM

## 2020-11-11 MED ORDER — VITAFOL GUMMIES 3.33-0.333-34.8 MG PO CHEW: 3.0000 | CHEWABLE_TABLET | Freq: Every day | ORAL | 11 refills | Status: DC

## 2020-11-11 MED ORDER — BLOOD PRESSURE KIT DEVI: 1.0000 | 0 refills | Status: DC

## 2020-11-11 NOTE — Progress Notes (Signed)
PRENATAL INTAKE SUMMARY  Ms. Yaden presents today New OB Nurse Interview.  OB History    Gravida  2   Para  1   Term  1   Preterm      AB      Living  1     SAB      IAB      Ectopic      Multiple      Live Births  1          I have reviewed the patient's medical, obstetrical, social, and family histories, medications, and available lab results.  SUBJECTIVE She has no unusual complaints  OBJECTIVE Initial Physical Exam (New OB)  GENERAL APPEARANCE: alert, well appearing   ASSESSMENT Normal pregnancy  PLAN Prenatal care to be completed at [place] OB labs to be completed at [facility] provider visit Baby Scripts ordered Blood pressure kit ordered U/S performed today reveals single live IUP at 31w6dby [place] FHR 171 PHQ 2 score: 0 GAD 7 score: 0

## 2020-11-11 NOTE — Telephone Encounter (Signed)
Transition Care Management Unsuccessful Follow-up Telephone Call  Date of discharge and from where:  11/09/2020 - [facility]  Attempts:  1st Attempt  Reason for unsuccessful TCM follow-up call:  Left voice message 

## 2020-11-12 NOTE — Telephone Encounter (Signed)
Transition Care Management Unsuccessful Follow-up Telephone Call  Date of discharge and from where:  11/09/2020 - [facility]  Attempts:  3rd Attempt  Reason for unsuccessful TCM follow-up call:  Left voice message

## 2020-11-17 ENCOUNTER — Encounter: Payer: Self-pay | Admitting: Obstetrics and Gynecology

## 2020-11-17 ENCOUNTER — Other Ambulatory Visit: Payer: Self-pay

## 2020-11-17 ENCOUNTER — Ambulatory Visit (INDEPENDENT_AMBULATORY_CARE_PROVIDER_SITE_OTHER): Payer: Medicaid Other | Admitting: Obstetrics and Gynecology

## 2020-11-17 VITALS — BP 138/77 | HR 91 | Wt 169.3 lb

## 2020-11-17 DIAGNOSIS — O34219 Maternal care for unspecified type scar from previous cesarean delivery: Secondary | ICD-10-CM

## 2020-11-17 DIAGNOSIS — O99891 Other specified diseases and conditions complicating pregnancy: Secondary | ICD-10-CM

## 2020-11-17 DIAGNOSIS — Z3A11 11 weeks gestation of pregnancy: Secondary | ICD-10-CM

## 2020-11-17 DIAGNOSIS — O09291 Supervision of pregnancy with other poor reproductive or obstetric history, first trimester: Secondary | ICD-10-CM

## 2020-11-17 DIAGNOSIS — O99211 Obesity complicating pregnancy, first trimester: Secondary | ICD-10-CM

## 2020-11-17 DIAGNOSIS — R635 Abnormal weight gain: Secondary | ICD-10-CM | POA: Diagnosis not present

## 2020-11-17 DIAGNOSIS — Z7189 Other specified counseling: Secondary | ICD-10-CM

## 2020-11-17 DIAGNOSIS — Z3491 Encounter for supervision of normal pregnancy, unspecified, first trimester: Secondary | ICD-10-CM | POA: Diagnosis not present

## 2020-11-17 DIAGNOSIS — Z8774 Personal history of (corrected) congenital malformations of heart and circulatory system: Secondary | ICD-10-CM

## 2020-11-17 DIAGNOSIS — O09299 Supervision of pregnancy with other poor reproductive or obstetric history, unspecified trimester: Secondary | ICD-10-CM | POA: Insufficient documentation

## 2020-11-17 DIAGNOSIS — Z23 Encounter for immunization: Secondary | ICD-10-CM | POA: Diagnosis not present

## 2020-11-17 DIAGNOSIS — Z98891 History of uterine scar from previous surgery: Secondary | ICD-10-CM

## 2020-11-17 DIAGNOSIS — O2601 Excessive weight gain in pregnancy, first trimester: Secondary | ICD-10-CM | POA: Diagnosis not present

## 2020-11-17 DIAGNOSIS — O2600 Excessive weight gain in pregnancy, unspecified trimester: Secondary | ICD-10-CM | POA: Insufficient documentation

## 2020-11-17 DIAGNOSIS — E669 Obesity, unspecified: Secondary | ICD-10-CM | POA: Diagnosis not present

## 2020-11-17 DIAGNOSIS — O99210 Obesity complicating pregnancy, unspecified trimester: Secondary | ICD-10-CM | POA: Insufficient documentation

## 2020-11-17 DIAGNOSIS — Z6830 Body mass index (BMI) 30.0-30.9, adult: Secondary | ICD-10-CM | POA: Diagnosis not present

## 2020-11-17 DIAGNOSIS — Z3481 Encounter for supervision of other normal pregnancy, first trimester: Secondary | ICD-10-CM | POA: Diagnosis not present

## 2020-11-17 MED ORDER — ASPIRIN EC 81 MG PO TBEC: 81.0000 mg | DELAYED_RELEASE_TABLET | Freq: Every day | ORAL | 2 refills | Status: DC

## 2020-11-17 NOTE — Progress Notes (Signed)
Last pap 07/2019- pap

## 2020-11-17 NOTE — Addendum Note (Signed)
Addended by: Cheree Ditto, Schelly Chuba A on: 11/17/2020 02:41 PM   Modules accepted: Orders

## 2020-11-17 NOTE — Progress Notes (Signed)
INITIAL PRENATAL VISIT NOTE  Subjective:  Kayla Bradley is a 30 y.o. G2P1001 at 58w5dby 1st trim [place] seen today for her initial prenatal visit. This is an unplanned pregnancy. She and partner are happy with the pregnancy. She was using nothing for birth control previously. She has an obstetric history significant for elective primary c-section for suspected macrosomia, infant was 9'1". She has a medical history significant for congenital heart defect, repaired age 30 no ongoing known issues.  Patient reports no complaints.   . Vag. Bleeding: None.  Movement: Absent. Denies leaking of fluid.    Past Medical History:  Diagnosis Date  . Dental infection   . Dyspnea   . Postpartum hemorrhage     Past Surgical History:  Procedure Laterality Date  . CESAREAN SECTION    . heart defect fixed      OB History  Gravida Para Term Preterm AB Living  '2 1 1     1  ' SAB IAB Ectopic Multiple Live Births          1    # Outcome Date GA Lbr Len/2nd Weight Sex Delivery Anes PTL Lv  2 Current           1 Term 02/04/20 451w0d9 lb (4.082 kg) M CS-LTranv   LIV    Social History   Socioeconomic History  . Marital status: Married    Spouse name: Not on file  . Number of children: 1  . Years of education: Not on file  . Highest education level: Not on file  Occupational History  . 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 Occupation: UNEMPLOYED  Tobacco Use  . Smoking status: Former Smoker    Types: Cigarettes  . Smokeless tobacco: Never Used  Vaping Use  . Vaping Use: Never used  Substance and Sexual Activity  . Alcohol use: Not Currently    Alcohol/week: 1.0 standard drink    Types: 1 Cans of beer per week  . Drug use: Not Currently    Frequency: 7.0 times per week    Types: Marijuana    Comment: 2 years ago  . Sexual activity: Yes    Partners: Male    Birth control/protection: None  Other Topics Concern  . Not on file  Social History Narrative  . Not on  file   Social Determinants of Health   Financial Resource Strain: Not on file  Food Insecurity: Not on file  Transportation Needs: Not on file  Physical Activity: Not on file  Stress: Not on file  Social Connections: Not on file    Family History  Problem Relation Age of Onset  . Diabetes Father   . Diabetes Sister   . Heart disease Maternal Grandfather     Current Outpatient Medications:  .  aspirin EC 81 MG tablet, Take 1 tablet (81 mg total) by mouth daily. Take after 12 weeks for prevention of preeclampsia later in pregnancy, Disp: 300 tablet, Rfl: 2 .  Blood Pressure Monitoring (BLOOD PRESSURE KIT) DEVI, 1 kit by Does not apply route once a week., Disp: 1 each, Rfl: 0 .  Prenatal Vit-Fe Phos-FA-Omega (VITAFOL GUMMIES) 3.33-0.333-34.8 MG CHEW, Chew 3 tablets by mouth daily., Disp: 90 tablet, Rfl: 11  Allergies  Allergen Reactions  . Escitalopram Other (See Comments)    Made patient feel crazy    Review of Systems: Negative except for what is mentioned in HPI.  Objective:   Vitals:  11/17/20 1316  BP: 138/77  Pulse: 91  Weight: 169 lb 4.8 oz (76.8 kg)   Fetal Status: Fetal Heart Rate (bpm): 172   Movement: Absent     Physical Exam: BP 138/77   Pulse 91   Wt 169 lb 4.8 oz (76.8 kg)   LMP 08/27/2020 (Approximate)   BMI 29.99 kg/m  CONSTITUTIONAL: Well-developed, well-nourished female in no acute distress.  NEUROLOGIC: Alert and oriented to person, place, and time. Normal reflexes, muscle tone coordination. No cranial nerve deficit noted. PSYCHIATRIC: Normal mood and affect. Normal behavior. Normal judgment and thought content. SKIN: Skin is warm and dry. No rash noted. Not diaphoretic. No erythema. No pallor. HENT:  Normocephalic, atraumatic, External right and left ear normal. Oropharynx is clear and moist EYES: Conjunctivae and EOM are normal. Pupils are equal, round, and reactive to light. No scleral icterus.  NECK: Normal range of motion, supple, no  masses CARDIOVASCULAR: Normal heart rate noted, regular rhythm RESPIRATORY: Effort and breath sounds normal, no problems with respiration noted BREASTS: symmetric, non-tender, no masses palpable ABDOMEN: Soft, nontender, nondistended, gravid. Well healed pfannenstiel GU: normal appearing external female genitalia, nulliparous normal appearing cervix, scant white discharge in vagina, no lesions noted Bimanual: 12 weeks sized uterus, no adnexal tenderness or palpable lesions noted MUSCULOSKELETAL: Normal range of motion. EXT:  No edema and no ten   Assessment and Plan:  Pregnancy: G2P1001 at 12w5dby 1st trim [place] 1. [redacted] weeks gestation of pregnancy  2. Encounter for supervision of normal pregnancy in first trimester, unspecified gravidity - Culture, OB Urine - CBC/D/Plt+RPR+Rh+ABO+Rub Ab... - [place] OB COMP + 14 WK; Future - Genetic Screening - Hemoglobin A1c - [facility]ed [facility] [employer] structure, multiple providers, fellows, medical students, virtual visits, MyChart.   3. H/O: C-section - Elective LTCS in CE due to macrosomia, infant was 4082 gms, no DM/GDM. - did not attempt to labor  4. H/O postpartum hemorrhage, currently pregnant - 2/2 atony  5. Class 1 obesity without serious comorbidity with body mass index (BMI) of 30.0 to 30.9 in adult, unspecified obesity type - start baby ASA - TSH  6. History of congenital heart defect - Saw cardiology in last pregnancy, states she was cleared and not told she needed to do anything special but periodically see cards - to f/u with cardiology - EMR records show she is s/p VSD repair, may/may not have SVT - s/p echo  7. Weight gain - gained 70 pounds with last pregnancy - has lost 30 pounds  8. Counseled about COVID-19 virus infection COVID-19 Vaccine Counseling: The patient was counseled on the potential benefits and lack of known risks of COVID vaccination, during pregnancy and breastfeeding, during today's  visit. The patient's questions and concerns were addressed today, including safety of the vaccination and potential side effects as they have been published by ACOG and SMFM. The patient has been informed that there have not been any documented vaccine related injuries, deaths or birth defects to infant or mom after receiving the COVID-19 vaccine to date. The patient has been made aware that although she is not at increased risk of contracting COVID-19 during pregnancy, she is at increased risk of developing severe disease and complications if she contracts COVID-19 while pregnant. All patient questions were addressed during our visit today. The patient is still unsure of her decision for vaccination.   9. Abnormal weight gain during pregnancy   Preterm labor symptoms and general obstetric precautions including but not limited  to vaginal bleeding, contractions, leaking of fluid and fetal movement were reviewed in detail with the patient.  Please refer to After Visit Summary for other counseling recommendations.   Return in about 4 weeks (around 12/15/2020) for high OB, in person.  Sloan Leiter 11/17/2020 2:32 PM

## 2020-11-17 NOTE — Patient Instructions (Signed)
Go to the [facility] at [facility] Address: [address] [place], [place] [address] Phone: [phone]

## 2020-11-18 LAB — CBC/D/PLT+RPR+RH+ABO+RUB AB...
Antibody Screen: NEGATIVE
Basophils Absolute: 0.1 10*3/uL (ref 0.0–0.2)
Basos: 0 %
EOS (ABSOLUTE): 0.1 10*3/uL (ref 0.0–0.4)
Eos: 1 %
HCV Ab: <0.1 s/co ratio (ref 0.0–0.9)
HIV Screen 4th Generation wRfx: NONREACTIVE
Hematocrit: 39.5 % (ref 34.0–46.6)
Hemoglobin: 12.9 g/dL (ref 11.1–15.9)
Hepatitis B Surface Ag: NEGATIVE
Immature Grans (Abs): 0.1 10*3/uL (ref 0.0–0.1)
Immature Granulocytes: 1 %
Lymphocytes Absolute: 1.8 10*3/uL (ref 0.7–3.1)
Lymphs: 15 %
MCH: 28.3 pg (ref 26.6–33.0)
MCHC: 32.7 g/dL (ref 31.5–35.7)
MCV: 87 fL (ref 79–97)
Monocytes Absolute: 0.4 10*3/uL (ref 0.1–0.9)
Monocytes: 3 %
Neutrophils Absolute: 10.1 10*3/uL — ABNORMAL HIGH (ref 1.4–7.0)
Neutrophils: 80 %
Platelets: 347 10*3/uL (ref 150–450)
RBC: 4.56 x10E6/uL (ref 3.77–5.28)
RDW: 13.7 % (ref 11.7–15.4)
RPR Ser Ql: NONREACTIVE
Rh Factor: POSITIVE
Rubella Antibodies, IGG: 2.67 index (ref >0.99)
WBC: 12.5 10*3/uL — ABNORMAL HIGH (ref 3.4–10.8)

## 2020-11-18 LAB — HEMOGLOBIN A1C
Est. average glucose Bld gHb Est-mCnc: 108 mg/dL
Hgb A1c MFr Bld: 5.4 % (ref 4.8–5.6)

## 2020-11-18 LAB — TSH: TSH: 2.62 u[IU]/mL (ref 0.450–4.500)

## 2020-11-18 LAB — HCV INTERPRETATION

## 2020-11-24 ENCOUNTER — Encounter: Payer: Self-pay | Admitting: Obstetrics and Gynecology

## 2020-12-01 ENCOUNTER — Encounter: Payer: Self-pay | Admitting: Obstetrics and Gynecology

## 2020-12-15 ENCOUNTER — Other Ambulatory Visit: Payer: Self-pay

## 2020-12-15 ENCOUNTER — Ambulatory Visit (INDEPENDENT_AMBULATORY_CARE_PROVIDER_SITE_OTHER): Payer: Medicaid Other | Admitting: Obstetrics and Gynecology

## 2020-12-15 VITALS — BP 127/68 | HR 92 | Wt 172.0 lb

## 2020-12-15 DIAGNOSIS — Z3A15 15 weeks gestation of pregnancy: Secondary | ICD-10-CM

## 2020-12-15 DIAGNOSIS — Z3491 Encounter for supervision of normal pregnancy, unspecified, first trimester: Secondary | ICD-10-CM | POA: Diagnosis not present

## 2020-12-15 DIAGNOSIS — O09299 Supervision of pregnancy with other poor reproductive or obstetric history, unspecified trimester: Secondary | ICD-10-CM

## 2020-12-15 DIAGNOSIS — E669 Obesity, unspecified: Secondary | ICD-10-CM

## 2020-12-15 DIAGNOSIS — Z98891 History of uterine scar from previous surgery: Secondary | ICD-10-CM

## 2020-12-15 DIAGNOSIS — Z6830 Body mass index (BMI) 30.0-30.9, adult: Secondary | ICD-10-CM

## 2020-12-15 NOTE — Progress Notes (Signed)
° °  PRENATAL VISIT NOTE  Subjective:  Kayla Bradley is a 30 y.o. G2P1001 at [redacted]w[redacted]d being seen today for ongoing prenatal care.  She is currently monitored for the following issues for this low-risk pregnancy and has Thoracic scoliosis; ANA positive; Other fatigue; Asymptomatic varicose veins of both lower extremities; Encounter for supervision of normal pregnancy in first trimester; H/O: C-section; H/O postpartum hemorrhage, currently pregnant; Obesity; History of congenital heart defect; and Abnormal weight gain during pregnancy on their problem list.  Patient reports some brown spotting. No associated with cramping or dysuria. No vaginal irritation. No bright red blood. Brown discharge is light in color.  Contractions: Not present. Vag. Bleeding: None.  Movement: Absent. Denies leaking of fluid.   The following portions of the patient's history were reviewed and updated as appropriate: allergies, current medications, past family history, past medical history, past social history, past surgical history and problem list.   Objective:   Vitals:   12/15/20 1505  BP: 127/68  Pulse: 92  Weight: 172 lb (78 kg)    Fetal Status:     Movement: Absent     General:  Alert, oriented and cooperative. Patient is in no acute distress.  Skin: Skin is warm and dry. No rash noted.   Cardiovascular: Normal heart rate noted  Respiratory: Normal respiratory effort, no problems with respiration noted  Abdomen: Soft, gravid, appropriate for gestational age.  Pain/Pressure: Absent     Pelvic: Cervical exam deferred        Extremities: Normal range of motion.  Edema: None  Mental Status: Normal mood and affect. Normal behavior. Normal judgment and thought content.   Assessment and Plan:  Pregnancy: G2P1001 at [redacted]w[redacted]d 1. Encounter for supervision of normal pregnancy in first trimester, unspecified gravidity -doing well overall. FHR normal, reassuring of physiologic discharge. Counseled on return  precautions.   2. [redacted] weeks gestation of pregnancy    3. H/O: C-section -discussed hx - had primary CS due to suspected fetal macrosomia. Had failed one hour gtt but passed 3 hour. Also had > 70 lb weight gain in last pregnancy. A1C 5.4, normal TSH on initial prenatal labs. Discussed with patient and she is still considering, but leaning towards TOLAC.   4. H/O postpartum hemorrhage, currently pregnant  5. Class 1 obesity without serious comorbidity with body mass index (BMI) of 30.0 to 30.9 in adult, unspecified obesity type -on asa   Preterm labor symptoms and general obstetric precautions including but not limited to vaginal bleeding, contractions, leaking of fluid and fetal movement were reviewed in detail with the patient. Please refer to After Visit Summary for other counseling recommendations.   No follow-ups on file.  Future Appointments  Date Time Provider Department Center  01/07/2021 10:45 AM [facility] US5 [facility]    Gita Kudo, MD

## 2020-12-15 NOTE — Progress Notes (Signed)
Pt states she had some brown spotting and d/c. No recent intercourse or vaginal exams.

## 2020-12-17 LAB — AFP, SERUM, OPEN SPINA BIFIDA
AFP MoM: 1.52
AFP Value: 44 ng/mL
Gest. Age on Collection Date: 15.5 weeks
Maternal Age At EDD: 28.9 yr
OSBR Risk 1 IN: 2591
Test Results:: NEGATIVE
Weight: 172 [lb_av]

## 2021-01-07 ENCOUNTER — Ambulatory Visit: Payer: Medicaid Other | Attending: Obstetrics and Gynecology

## 2021-01-07 ENCOUNTER — Other Ambulatory Visit: Payer: Self-pay | Admitting: Obstetrics and Gynecology

## 2021-01-07 ENCOUNTER — Other Ambulatory Visit: Payer: Self-pay

## 2021-01-07 DIAGNOSIS — Z3491 Encounter for supervision of normal pregnancy, unspecified, first trimester: Secondary | ICD-10-CM | POA: Diagnosis not present

## 2021-01-08 ENCOUNTER — Other Ambulatory Visit: Payer: Self-pay | Admitting: *Deleted

## 2021-01-08 DIAGNOSIS — Z362 Encounter for other antenatal screening follow-up: Secondary | ICD-10-CM

## 2021-01-12 ENCOUNTER — Other Ambulatory Visit: Payer: Self-pay

## 2021-01-12 ENCOUNTER — Encounter: Payer: Self-pay | Admitting: Obstetrics and Gynecology

## 2021-01-12 ENCOUNTER — Ambulatory Visit (INDEPENDENT_AMBULATORY_CARE_PROVIDER_SITE_OTHER): Payer: Medicaid Other | Admitting: Obstetrics and Gynecology

## 2021-01-12 VITALS — BP 131/82 | HR 101 | Wt 174.0 lb

## 2021-01-12 DIAGNOSIS — Z3491 Encounter for supervision of normal pregnancy, unspecified, first trimester: Secondary | ICD-10-CM

## 2021-01-12 DIAGNOSIS — Z6830 Body mass index (BMI) 30.0-30.9, adult: Secondary | ICD-10-CM

## 2021-01-12 DIAGNOSIS — E669 Obesity, unspecified: Secondary | ICD-10-CM

## 2021-01-12 DIAGNOSIS — Z98891 History of uterine scar from previous surgery: Secondary | ICD-10-CM

## 2021-01-12 NOTE — Progress Notes (Signed)
+   Fetal movement. No complaints.  

## 2021-01-12 NOTE — Progress Notes (Signed)
   PRENATAL VISIT NOTE  Subjective:  Kayla Bradley is a 30 y.o. G2P1001 at [redacted]w[redacted]d being seen today for ongoing prenatal care.  She is currently monitored for the following issues for this low-risk pregnancy and has Thoracic scoliosis; ANA positive; Other fatigue; Asymptomatic varicose veins of both lower extremities; Encounter for supervision of normal pregnancy in first trimester; H/O: C-section; H/O postpartum hemorrhage, currently pregnant; Obesity; History of congenital heart defect; and Abnormal weight gain during pregnancy on their problem list.  Patient reports no complaints.  Contractions: Not present. Vag. Bleeding: None.  Movement: Present. Denies leaking of fluid.   The following portions of the patient's history were reviewed and updated as appropriate: allergies, current medications, past family history, past medical history, past social history, past surgical history and problem list.   Objective:   Vitals:   01/12/21 1503  BP: 131/82  Pulse: (!) 101  Weight: 174 lb (78.9 kg)    Fetal Status: Fetal Heart Rate (bpm): 155   Movement: Present     General:  Alert, oriented and cooperative. Patient is in no acute distress.  Skin: Skin is warm and dry. No rash noted.   Cardiovascular: Normal heart rate noted  Respiratory: Normal respiratory effort, no problems with respiration noted  Abdomen: Soft, gravid, appropriate for gestational age.  Pain/Pressure: Absent     Pelvic: Cervical exam deferred        Extremities: Normal range of motion.  Edema: None  Mental Status: Normal mood and affect. Normal behavior. Normal judgment and thought content.   Assessment and Plan:  Pregnancy: G2P1001 at [redacted]w[redacted]d 1. Encounter for supervision of normal pregnancy in first trimester, unspecified gravidity Patient is doing well without complaints  2. H/O: C-section Considering TOLAC Information provided  3. Class 1 obesity without serious comorbidity with body mass index (BMI) of 30.0  to 30.9 in adult, unspecified obesity type   Preterm labor symptoms and general obstetric precautions including but not limited to vaginal bleeding, contractions, leaking of fluid and fetal movement were reviewed in detail with the patient. Please refer to After Visit Summary for other counseling recommendations.   Return in about 4 weeks (around 02/09/2021) for in person, ROB, Low risk.  Future Appointments  Date Time Provider Department Center  02/08/2021  2:15 PM [facility]  02/08/2021  2:30 PM [facility] US3 [facility]    Catalina Antigua, MD

## 2021-01-12 NOTE — Patient Instructions (Signed)
Vaginal Birth After Cesarean Delivery  Vaginal birth after cesarean delivery (VBAC) is giving birth vaginally after previously delivering a baby through a cesarean section (C-section). A VBAC may be a safe option for you, depending on your health and other factors. It is important to discuss VBAC with your health care provider early in your pregnancy so you can understand the risks, benefits, and options. Having these discussions early will give you time to make your birth plan. Who are the best candidates for VBAC? The best candidates for VBAC are women who:  Have had one or two prior cesarean deliveries, and the incision made during the delivery was horizontal (low transverse).  Do not have a vertical (classical) scar on their uterus.  Have not had a tear in the wall of their uterus (uterine rupture).  Plan to have more pregnancies. A VBAC is also more likely to be successful:  In women who have previously given birth vaginally.  When labor starts by itself (spontaneously) before the due date. What are the benefits of VBAC? The benefits of delivering your baby vaginally instead of by a cesarean delivery include:  A shorter hospital stay.  A faster recovery time.  Less pain.  Avoiding risks associated with major surgery, such as infection and blood clots.  Less blood loss and less need for donated blood (transfusions). What are the risks of VBAC? The main risk of attempting a VBAC is that it may fail, forcing your health care provider to deliver your baby by a C-section. Other risks are rare and include:  Tearing (rupture) of the scar from a past cesarean delivery.  Other risks associated with vaginal deliveries. If a repeat cesarean delivery is needed, the risks include:  Blood loss.  Infection.  Blood clot.  Damage to surrounding organs.  Removal of the uterus (hysterectomy), if it is damaged.  Placenta problems in future pregnancies. What else should I know  about my options? Delivering a baby through a VBAC is similar to having a normal spontaneous vaginal delivery. Therefore, it is safe:  To try with twins.  For your health care provider to try to turn the baby from a breech position (external cephalic version) during labor.  With epidural analgesia for pain relief. Consider where you would like to deliver your baby. VBAC should be attempted in facilities where an emergency cesarean delivery can be performed. VBAC is not recommended for home births. Any changes in your health or your baby's health during your pregnancy may make it necessary to change your initial decision about VBAC. Your health care provider may recommend that you do not attempt a VBAC if:  Your baby's suspected weight is 8.8 lb (4 kg) or more.  You have preeclampsia. This is a condition that causes high blood pressure along with other symptoms, such as swelling and headaches.  You will have VBAC less than 19 months after your cesarean delivery.  You are past your due date.  You need to have labor started (induced) because your cervix is not ready for labor (unfavorable). Where to find more information  American Pregnancy Association: americanpregnancy.org  American Congress of Obstetricians and Gynecologists: acog.org Summary  Vaginal birth after cesarean delivery (VBAC) is giving birth vaginally after previously delivering a baby through a cesarean section (C-section). A VBAC may be a safe option for you, depending on your health and other factors.  Discuss VBAC with your health care provider early in your pregnancy so you can understand the risks, benefits, options, and   have plenty of time to make your birth plan.  The main risk of attempting a VBAC is that it may fail, forcing your health care provider to deliver your baby by a C-section. Other risks are rare. This information is not intended to replace advice given to you by your health care provider. Make sure  you discuss any questions you have with your health care provider. Document Revised: 01/15/2019 Document Reviewed: 12/27/2016 Elsevier Patient Education  2021 Elsevier Inc.  

## 2021-01-22 ENCOUNTER — Telehealth: Payer: Self-pay

## 2021-01-31 ENCOUNTER — Encounter: Payer: Self-pay | Admitting: Emergency Medicine

## 2021-01-31 ENCOUNTER — Other Ambulatory Visit: Payer: Self-pay

## 2021-01-31 ENCOUNTER — Ambulatory Visit
Admission: EM | Admit: 2021-01-31 | Discharge: 2021-01-31 | Disposition: A | Discharge status: Home / Self Care | Payer: Medicaid Other | Attending: Emergency Medicine | Admitting: Emergency Medicine

## 2021-01-31 DIAGNOSIS — Z20822 Contact with and (suspected) exposure to covid-19: Secondary | ICD-10-CM | POA: Diagnosis not present

## 2021-01-31 DIAGNOSIS — J019 Acute sinusitis, unspecified: Secondary | ICD-10-CM | POA: Diagnosis not present

## 2021-01-31 MED ORDER — AMOXICILLIN 875 MG PO TABS: 875.0000 mg | ORAL_TABLET | Freq: Two times a day (BID) | ORAL | 0 refills | Status: AC

## 2021-01-31 NOTE — ED Provider Notes (Signed)
[facility]    CSN: [id number] Arrival date & time: 01/31/21  1024      History   Chief Complaint Chief Complaint  Patient presents with  . Nasal Congestion    HPI Kayla Bradley is a 30 y.o. female approximately 21 weeks 3 days pregnant presenting today for evaluation of URI symptoms.  Reports cough and congestion over the past 2 weeks, symptoms slightly improved, but worsened over the past week.  Reports coughing up yellow mucus.  Using Mucinex with some relief.  Denies any fevers.  Denies complications with pregnancy at this time.  HPI  Past Medical History:  Diagnosis Date  . Dental infection   . Dyspnea   . Postpartum hemorrhage     Patient Active Problem List   Diagnosis Date Noted  . H/O: C-section 11/17/2020  . H/O postpartum hemorrhage, currently pregnant 11/17/2020  . Obesity 11/17/2020  . History of congenital heart defect 11/17/2020  . Abnormal weight gain during pregnancy 11/17/2020  . Encounter for supervision of normal pregnancy in first trimester 11/11/2020  . Asymptomatic varicose veins of both lower extremities 08/10/2020  . ANA positive 11/22/2016  . Other fatigue 11/22/2016  . Thoracic scoliosis 08/08/2012    Past Surgical History:  Procedure Laterality Date  . CESAREAN SECTION    . heart defect fixed      OB History    Gravida  2   Para  1   Term  1   Preterm      AB      Living  1     SAB      IAB      Ectopic      Multiple      Live Births  1            Home Medications    Prior to Admission medications   Medication Sig Start Date End Date Taking? Authorizing Provider  amoxicillin (AMOXIL) 875 MG tablet Take 1 tablet (875 mg total) by mouth 2 (two) times daily for 7 days. 01/31/21 02/07/21 Yes Solina Heron C, PA-C  aspirin EC 81 MG tablet Take 1 tablet (81 mg total) by mouth daily. Take after 12 weeks for prevention of preeclampsia later in pregnancy 11/17/20   Sloan Leiter, MD  Blood Pressure  Monitoring (BLOOD PRESSURE KIT) DEVI 1 kit by Does not apply route once a week. 11/11/20   Sloan Leiter, MD  Prenatal Vit-Fe Phos-FA-Omega (VITAFOL GUMMIES) 3.33-0.333-34.8 MG CHEW Chew 3 tablets by mouth daily. 11/11/20   Sloan Leiter, MD    Family History Family History  Problem Relation Age of Onset  . Diabetes Father   . Diabetes Sister   . Heart disease Maternal Grandfather     Social History Social History   Tobacco Use  . Smoking status: Former Smoker    Types: Cigarettes  . Smokeless tobacco: Never Used  Vaping Use  . Vaping Use: Never used  Substance Use Topics  . Alcohol use: Not Currently    Alcohol/week: 1.0 standard drink    Types: 1 Cans of beer per week  . Drug use: Not Currently    Frequency: 7.0 times per week    Types: Marijuana    Comment: 2 years ago     Allergies   Escitalopram   Review of Systems Review of Systems  Constitutional: Negative for activity change, appetite change, chills, fatigue and fever.  HENT: Positive for congestion, rhinorrhea and sinus pressure. Negative for  ear pain, sore throat and trouble swallowing.   Eyes: Negative for discharge and redness.  Respiratory: Positive for cough. Negative for chest tightness and shortness of breath.   Cardiovascular: Negative for chest pain.  Gastrointestinal: Negative for abdominal pain, diarrhea, nausea and vomiting.  Musculoskeletal: Negative for myalgias.  Skin: Negative for rash.  Neurological: Negative for dizziness, light-headedness and headaches.     Physical Exam Triage Vital Signs ED Triage Vitals  Enc Vitals Group     BP      Pulse      Resp      Temp      Temp src      SpO2      Weight      Height      Head Circumference      Peak Flow      Pain Score      Pain Loc      Pain Edu?      Excl. in [place]?    No data found.  Updated Vital Signs BP 136/61 (BP Location: Left Arm)   Pulse 99   Temp 98.1 F (36.7 C) (Oral)   Resp 18   LMP 08/27/2020 (Approximate)    SpO2 98%   Visual Acuity Right Eye Distance:   Left Eye Distance:   Bilateral Distance:    Right Eye Near:   Left Eye Near:    Bilateral Near:     Physical Exam Vitals and nursing note reviewed.  Constitutional:      Appearance: She is well-developed.     Comments: No acute distress  HENT:     Head: Normocephalic and atraumatic.     Ears:     Comments: Bilateral ears without tenderness to palpation of external auricle, tragus and mastoid, EAC's without erythema or swelling, TM's with good bony landmarks and cone of light. Non erythematous.     Nose: Nose normal.     Mouth/Throat:     Comments: Oral mucosa pink and moist, no tonsillar enlargement or exudate. Posterior pharynx patent and nonerythematous, no uvula deviation or swelling. Normal phonation.  Eyes:     Conjunctiva/sclera: Conjunctivae normal.  Cardiovascular:     Rate and Rhythm: Normal rate.  Pulmonary:     Effort: Pulmonary effort is normal. No respiratory distress.     Comments: Breathing comfortably at rest, CTABL, no wheezing, rales or other adventitious sounds auscultated Abdominal:     General: There is no distension.  Musculoskeletal:        General: Normal range of motion.     Cervical back: Neck supple.  Skin:    General: Skin is warm and dry.  Neurological:     Mental Status: She is alert and oriented to person, place, and time.      UC Treatments / Results  Labs (all labs ordered are listed, but only abnormal results are displayed) Labs Reviewed  NOVEL CORONAVIRUS, NAA    EKG   Radiology No results found.  Procedures Procedures (including critical care time)  Medications Ordered in UC Medications - No data to display  Initial Impression / Assessment and Plan / UC Course  I have reviewed the triage vital signs and the nursing notes.  Pertinent labs & imaging results that were available during my care of the patient were reviewed by me and considered in my medical decision making  (see chart for details).     COVID test pending for screening-treating for sinusitis given double sickening and symptoms greater  than 10 days, covering with amoxicillin 875 twice daily x1 week.  Continue symptomatic and supportive care of cough and congestion as well.  Discussed strict return precautions. Patient verbalized understanding and is agreeable with plan.  Final Clinical Impressions(s) / UC Diagnoses   Final diagnoses:  Encounter for screening laboratory testing for COVID-19 virus  Acute sinusitis with symptoms > 10 days     Discharge Instructions     Amoxicillin twice daily for 1 week Rest and drink plenty of fluids Tylenol as needed Continue Mucinex Follow-up if not improving or worsening    ED Prescriptions    Medication Sig Dispense Auth. Provider   amoxicillin (AMOXIL) 875 MG tablet Take 1 tablet (875 mg total) by mouth 2 (two) times daily for 7 days. 14 tablet Delanna Blacketer, [place] C, PA-C     PDMP not reviewed this encounter.   Janith Lima, PA-C 01/31/21 1054

## 2021-01-31 NOTE — Discharge Instructions (Signed)
Amoxicillin twice daily for 1 week Rest and drink plenty of fluids Tylenol as needed Continue Mucinex Follow-up if not improving or worsening

## 2021-01-31 NOTE — ED Triage Notes (Signed)
Pt here for nasal congestion and cough x 2 weeks with some improvement between but became worse again; pt sts coughing up yellow mucous; pt is currently [redacted] weeks pregnant

## 2021-02-01 LAB — NOVEL CORONAVIRUS, NAA: SARS-CoV-2, NAA: NOT DETECTED

## 2021-02-08 ENCOUNTER — Encounter: Payer: Self-pay | Admitting: *Deleted

## 2021-02-08 ENCOUNTER — Other Ambulatory Visit: Payer: Self-pay

## 2021-02-08 ENCOUNTER — Ambulatory Visit: Payer: Medicaid Other | Attending: Obstetrics and Gynecology

## 2021-02-08 ENCOUNTER — Ambulatory Visit: Payer: Medicaid Other | Admitting: *Deleted

## 2021-02-08 DIAGNOSIS — E669 Obesity, unspecified: Secondary | ICD-10-CM

## 2021-02-08 DIAGNOSIS — Z3A22 22 weeks gestation of pregnancy: Secondary | ICD-10-CM | POA: Diagnosis not present

## 2021-02-08 DIAGNOSIS — Z3491 Encounter for supervision of normal pregnancy, unspecified, first trimester: Secondary | ICD-10-CM | POA: Diagnosis not present

## 2021-02-08 DIAGNOSIS — Z362 Encounter for other antenatal screening follow-up: Secondary | ICD-10-CM | POA: Diagnosis not present

## 2021-02-08 DIAGNOSIS — O99412 Diseases of the circulatory system complicating pregnancy, second trimester: Secondary | ICD-10-CM | POA: Diagnosis not present

## 2021-02-08 DIAGNOSIS — O321XX0 Maternal care for breech presentation, not applicable or unspecified: Secondary | ICD-10-CM

## 2021-02-08 DIAGNOSIS — O09292 Supervision of pregnancy with other poor reproductive or obstetric history, second trimester: Secondary | ICD-10-CM | POA: Diagnosis not present

## 2021-02-08 DIAGNOSIS — O34219 Maternal care for unspecified type scar from previous cesarean delivery: Secondary | ICD-10-CM

## 2021-02-08 DIAGNOSIS — O99212 Obesity complicating pregnancy, second trimester: Secondary | ICD-10-CM | POA: Diagnosis not present

## 2021-02-08 DIAGNOSIS — Q249 Congenital malformation of heart, unspecified: Secondary | ICD-10-CM

## 2021-02-08 DIAGNOSIS — Z363 Encounter for antenatal screening for malformations: Secondary | ICD-10-CM

## 2021-02-09 ENCOUNTER — Ambulatory Visit (INDEPENDENT_AMBULATORY_CARE_PROVIDER_SITE_OTHER): Payer: Medicaid Other | Admitting: Advanced Practice Midwife

## 2021-02-09 ENCOUNTER — Other Ambulatory Visit: Payer: Self-pay | Admitting: *Deleted

## 2021-02-09 VITALS — BP 141/73 | HR 89 | Wt 183.0 lb

## 2021-02-09 DIAGNOSIS — R102 Pelvic and perineal pain unspecified side: Secondary | ICD-10-CM

## 2021-02-09 DIAGNOSIS — Z98891 History of uterine scar from previous surgery: Secondary | ICD-10-CM

## 2021-02-09 DIAGNOSIS — Q210 Ventricular septal defect: Secondary | ICD-10-CM

## 2021-02-09 DIAGNOSIS — Z3491 Encounter for supervision of normal pregnancy, unspecified, first trimester: Secondary | ICD-10-CM

## 2021-02-09 DIAGNOSIS — O26899 Other specified pregnancy related conditions, unspecified trimester: Secondary | ICD-10-CM

## 2021-02-09 DIAGNOSIS — Z3A22 22 weeks gestation of pregnancy: Secondary | ICD-10-CM

## 2021-02-09 DIAGNOSIS — R030 Elevated blood-pressure reading, without diagnosis of hypertension: Secondary | ICD-10-CM

## 2021-02-09 NOTE — Patient Instructions (Signed)

## 2021-02-09 NOTE — Progress Notes (Signed)
+   fetal movement. No complaints.  

## 2021-02-09 NOTE — Progress Notes (Signed)
   PRENATAL VISIT NOTE  Subjective:  Kayla Bradley is a 30 y.o. G2P1001 at [redacted]w[redacted]d being seen today for ongoing prenatal care.  She is currently monitored for the following issues for this low-risk pregnancy and has Thoracic scoliosis; ANA positive; Other fatigue; Asymptomatic varicose veins of both lower extremities; Encounter for supervision of normal pregnancy in first trimester; H/O: C-section; H/O postpartum hemorrhage, currently pregnant; Obesity; History of congenital heart defect; and Abnormal weight gain during pregnancy on their problem list.  Patient reports no complaints.  Contractions: Not present. Vag. Bleeding: None.  Movement: Present. Denies leaking of fluid.   The following portions of the patient's history were reviewed and updated as appropriate: allergies, current medications, past family history, past medical history, past social history, past surgical history and problem list.   Objective:   Vitals:   02/09/21 1602  BP: (!) 141/73  Pulse: 89  Weight: 183 lb (83 kg)    Fetal Status: Fetal Heart Rate (bpm): 149   Movement: Present     General:  Alert, oriented and cooperative. Patient is in no acute distress.  Skin: Skin is warm and dry. No rash noted.   Cardiovascular: Normal heart rate noted  Respiratory: Normal respiratory effort, no problems with respiration noted  Abdomen: Soft, gravid, appropriate for gestational age.  Pain/Pressure: Absent     Pelvic: Cervical exam deferred        Extremities: Normal range of motion.  Edema: None  Mental Status: Normal mood and affect. Normal behavior. Normal judgment and thought content.   Assessment and Plan:  Pregnancy: G2P1001 at 105w5d 1. Elevated BP without diagnosis of hypertension --BP 140s/70s today, no s/sx of PEC --Retake wnl --Take BP weekly on home cuff  2. Encounter for supervision of normal pregnancy in first trimester, unspecified gravidity --Anticipatory guidance about next visits/weeks of  pregnancy given. --Next visit in 4-5 weeks for GTT  3. H/O: C-section --Desires VBAC, needs consent  4. [redacted] weeks gestation of pregnancy  5. Pain of round ligament affecting pregnancy, antepartum --Mild intermittent bilateral lower abdominal sharp pain with movement.  --Rest/ice/heat/warm bath/Tylenol/pregnancy support belt  Preterm labor symptoms and general obstetric precautions including but not limited to vaginal bleeding, contractions, leaking of fluid and fetal movement were reviewed in detail with the patient. Please refer to After Visit Summary for other counseling recommendations.   Return in about 5 weeks (around 03/16/2021).  Future Appointments  Date Time Provider Department Center  03/09/2021  2:30 PM [facility] NURSE [facility]  03/09/2021  2:45 PM [facility] US5 [facility]  03/16/2021  8:45 AM [facility] None  03/16/2021  9:15 AM Leftwich-Kirby, Wilmer Floor, CNM [facility] None    Sharen Counter, CNM

## 2021-03-09 ENCOUNTER — Ambulatory Visit: Payer: Medicaid Other

## 2021-03-16 ENCOUNTER — Other Ambulatory Visit: Payer: Medicaid Other

## 2021-03-16 ENCOUNTER — Encounter: Payer: Medicaid Other | Admitting: Advanced Practice Midwife

## 2021-03-16 NOTE — Progress Notes (Deleted)
   PRENATAL VISIT NOTE  Subjective:  Kayla Bradley is a 30 y.o. G2P1001 at [redacted]w[redacted]d being seen today for ongoing prenatal care.  She is currently monitored for the following issues for this {Blank single:19197::"high-risk","low-risk"} pregnancy and has Thoracic scoliosis; ANA positive; Other fatigue; Asymptomatic varicose veins of both lower extremities; Encounter for supervision of normal pregnancy in first trimester; H/O: C-section; H/O postpartum hemorrhage, currently pregnant; Obesity; History of congenital heart defect; and Abnormal weight gain during pregnancy on their problem list.  Patient reports {sx:14538}.   .  .   . Denies leaking of fluid.   The following portions of the patient's history were reviewed and updated as appropriate: allergies, current medications, past family history, past medical history, past social history, past surgical history and problem list.   Objective:  There were no vitals filed for this visit.  Fetal Status:           General:  Alert, oriented and cooperative. Patient is in no acute distress.  Skin: Skin is warm and dry. No rash noted.   Cardiovascular: Normal heart rate noted  Respiratory: Normal respiratory effort, no problems with respiration noted  Abdomen: Soft, gravid, appropriate for gestational age.        Pelvic: {Blank single:19197::"Cervical exam performed in the presence of a chaperone","Cervical exam deferred"}        Extremities: Normal range of motion.     Mental Status: Normal mood and affect. Normal behavior. Normal judgment and thought content.   Assessment and Plan:  Pregnancy: G2P1001 at [redacted]w[redacted]d 1. Encounter for supervision of normal pregnancy in first trimester, unspecified gravidity   2. Elevated BP without diagnosis of hypertension   3. H/O: C-section   4. [redacted] weeks gestation of pregnancy   {Blank single:19197::"Term","Preterm"} labor symptoms and general obstetric precautions including but not limited to vaginal  bleeding, contractions, leaking of fluid and fetal movement were reviewed in detail with the patient. Please refer to After Visit Summary for other counseling recommendations.   No follow-ups on file.  Future Appointments  Date Time Provider Department Center  03/16/2021  8:45 AM [facility] None  03/16/2021  9:15 AM Leftwich-Kirby, Wilmer Floor, CNM [facility] None  03/23/2021  3:15 PM [facility]SE [facility]  03/23/2021  3:30 PM [facility] US3 [facility]    Sharen Counter, CNM

## 2021-03-18 DIAGNOSIS — Q250 Patent ductus arteriosus: Secondary | ICD-10-CM | POA: Diagnosis not present

## 2021-03-18 DIAGNOSIS — Q211 Atrial septal defect: Secondary | ICD-10-CM | POA: Diagnosis not present

## 2021-03-23 ENCOUNTER — Ambulatory Visit: Payer: Medicaid Other | Attending: Obstetrics

## 2021-03-23 ENCOUNTER — Other Ambulatory Visit: Payer: Self-pay

## 2021-03-23 ENCOUNTER — Encounter: Payer: Self-pay | Admitting: *Deleted

## 2021-03-23 ENCOUNTER — Ambulatory Visit: Payer: Medicaid Other | Admitting: *Deleted

## 2021-03-23 VITALS — BP 125/66 | HR 102

## 2021-03-23 DIAGNOSIS — O99343 Other mental disorders complicating pregnancy, third trimester: Secondary | ICD-10-CM | POA: Diagnosis not present

## 2021-03-23 DIAGNOSIS — Z362 Encounter for other antenatal screening follow-up: Secondary | ICD-10-CM | POA: Diagnosis not present

## 2021-03-23 DIAGNOSIS — Z3A28 28 weeks gestation of pregnancy: Secondary | ICD-10-CM | POA: Diagnosis not present

## 2021-03-23 DIAGNOSIS — Q210 Ventricular septal defect: Secondary | ICD-10-CM | POA: Insufficient documentation

## 2021-03-23 DIAGNOSIS — O34219 Maternal care for unspecified type scar from previous cesarean delivery: Secondary | ICD-10-CM | POA: Diagnosis not present

## 2021-03-23 DIAGNOSIS — E669 Obesity, unspecified: Secondary | ICD-10-CM | POA: Diagnosis not present

## 2021-03-23 DIAGNOSIS — Z6830 Body mass index (BMI) 30.0-30.9, adult: Secondary | ICD-10-CM | POA: Insufficient documentation

## 2021-03-23 DIAGNOSIS — O99213 Obesity complicating pregnancy, third trimester: Secondary | ICD-10-CM

## 2021-03-24 ENCOUNTER — Other Ambulatory Visit: Payer: Self-pay | Admitting: *Deleted

## 2021-03-24 DIAGNOSIS — Z6830 Body mass index (BMI) 30.0-30.9, adult: Secondary | ICD-10-CM

## 2021-03-25 ENCOUNTER — Other Ambulatory Visit: Payer: Self-pay

## 2021-03-25 ENCOUNTER — Other Ambulatory Visit: Payer: Medicaid Other

## 2021-03-25 DIAGNOSIS — Z3483 Encounter for supervision of other normal pregnancy, third trimester: Secondary | ICD-10-CM

## 2021-03-26 ENCOUNTER — Encounter: Payer: Self-pay | Admitting: Obstetrics and Gynecology

## 2021-03-26 DIAGNOSIS — O24419 Gestational diabetes mellitus in pregnancy, unspecified control: Secondary | ICD-10-CM | POA: Insufficient documentation

## 2021-03-26 LAB — CBC
Hematocrit: 32.8 % — ABNORMAL LOW (ref 34.0–46.6)
Hemoglobin: 10.6 g/dL — ABNORMAL LOW (ref 11.1–15.9)
MCH: 27.6 pg (ref 26.6–33.0)
MCHC: 32.3 g/dL (ref 31.5–35.7)
MCV: 85 fL (ref 79–97)
Platelets: 228 10*3/uL (ref 150–450)
RBC: 3.84 x10E6/uL (ref 3.77–5.28)
RDW: 13.1 % (ref 11.7–15.4)
WBC: 12.2 10*3/uL — ABNORMAL HIGH (ref 3.4–10.8)

## 2021-03-26 LAB — GLUCOSE TOLERANCE, 2 HOURS W/ 1HR
Glucose, 1 hour: 230 mg/dL — ABNORMAL HIGH (ref 65–179)
Glucose, 2 hour: 198 mg/dL — ABNORMAL HIGH (ref 65–152)
Glucose, Fasting: 115 mg/dL — ABNORMAL HIGH (ref 65–91)

## 2021-03-26 LAB — RPR: RPR Ser Ql: NONREACTIVE

## 2021-03-26 LAB — HIV ANTIBODY (ROUTINE TESTING W REFLEX): HIV Screen 4th Generation wRfx: NONREACTIVE

## 2021-03-29 ENCOUNTER — Other Ambulatory Visit: Payer: Self-pay

## 2021-03-29 DIAGNOSIS — O24410 Gestational diabetes mellitus in pregnancy, diet controlled: Secondary | ICD-10-CM

## 2021-03-29 MED ORDER — ACCU-CHEK AVIVA PLUS W/DEVICE KIT: 1.0000 | PACK | Freq: Once | 0 refills | Status: AC

## 2021-03-29 MED ORDER — ACCU-CHEK SOFTCLIX LANCETS MISC: 1 refills | Status: DC

## 2021-03-29 MED ORDER — ACCU-CHEK GUIDE VI STRP: ORAL_STRIP | 12 refills | Status: DC

## 2021-03-29 NOTE — Telephone Encounter (Signed)
Patient advised of GDM and referral sent to DM.

## 2021-04-01 ENCOUNTER — Other Ambulatory Visit: Payer: Self-pay

## 2021-04-01 ENCOUNTER — Ambulatory Visit: Payer: Medicaid Other | Admitting: Registered"

## 2021-04-01 ENCOUNTER — Encounter: Payer: Medicaid Other | Admitting: Advanced Practice Midwife

## 2021-04-01 ENCOUNTER — Encounter: Payer: Medicaid Other | Attending: Obstetrics and Gynecology | Admitting: Registered"

## 2021-04-01 ENCOUNTER — Telehealth: Payer: Self-pay

## 2021-04-01 DIAGNOSIS — O24410 Gestational diabetes mellitus in pregnancy, diet controlled: Secondary | ICD-10-CM | POA: Diagnosis not present

## 2021-04-01 DIAGNOSIS — O24419 Gestational diabetes mellitus in pregnancy, unspecified control: Secondary | ICD-10-CM

## 2021-04-01 NOTE — Telephone Encounter (Signed)
Power outage, called patient to cancel/reschedule appointment, no answer, left voicemail

## 2021-04-06 NOTE — Progress Notes (Signed)
Patient was seen on 04/01/21 for Gestational Diabetes self-management. Estimated due date: 06/10/21; [redacted]w[redacted]d  Clinical: Medications: prenatal vitamins Medical History: reviewed Labs: OGTT all 3 elevated, A1c 5.4%   Dietary and Lifestyle History: Since diagnosis has made changes in snacks and has stopped drinking occasional soda   Pt states in 2018 she developed a rash all over her body and lost weight to do nausea and limited intake. During that time limited mostly to eggs and toast. Pt reports her WBC was really high but multiple tests did not reveal source of issues.  Physical Activity: ADL due to swelling and pain. Was walking daily. Stress: not assessed Sleep: in bed by 10pm but does not get good sleep due to difficulty getting comfortable and frequent urination.  24 hr Recall: First Meal: oatmeal apple cinnamon Snack: Second meal: cheese, crackers, cantaloupe Snack: Third meal: steak, Brussels Sprouts, carrots, 8 oz apple juice Snack: avocado Beverages: water 4 bottles, juice, coffee with 2 tsp sugar and a little milk 4-5 x/week  NUTRITION INTERVENTION  Nutrition education (E-1) on the following topics:   Initial Follow-up  [x]  []  Definition of Gestational Diabetes [x]  []  Why dietary management is important in controlling blood glucose [x]  []  Effects each nutrient has on blood glucose levels []  []  Simple carbohydrates vs complex carbohydrates []  []  Fluid intake [x]  []  Creating a balanced meal plan [x]  []  Carbohydrate counting  [x]  []  When to check blood glucose levels [x]  []  Proper blood glucose monitoring techniques [x]  []  Effect of stress and stress reduction techniques  [x]  []  Exercise effect on blood glucose levels, appropriate exercise during pregnancy [x]  []  Importance of limiting caffeine and abstaining from alcohol and smoking [x]  []  Medications used for blood sugar control during pregnancy [x]  []  Hypoglycemia and rule of 15 [x]  []  Postpartum self  care  Patient already has a meter, is not testing pre breakfast and 2 hours after each meal. Patient learned to use meter in visit. CBG: 141 mg/dL (fasting)  Patient instructed to monitor glucose levels: FBS: 60 - ? 95 mg/dL (some clinics use 90 for cutoff) 1 hour: ? 140 mg/dL 2 hour: ? mg/dL  Patient will also try following to reduce FBS: Eat dinner earlier No juice Try chair exercises  Patient received handouts: Nutrition Diabetes and Pregnancy Carbohydrate Counting List  Patient will be seen for follow-up as needed.

## 2021-04-07 ENCOUNTER — Telehealth: Payer: Self-pay | Admitting: *Deleted

## 2021-04-07 NOTE — Telephone Encounter (Signed)
Pt called to office for refill on PNV and testing supplies. In review of chart, pt should have refills available at pharmacy.   Made aware if pt would like to use a different pharmacy, she may have Rx's transferred.

## 2021-04-12 ENCOUNTER — Ambulatory Visit (INDEPENDENT_AMBULATORY_CARE_PROVIDER_SITE_OTHER): Payer: Medicaid Other | Admitting: Advanced Practice Midwife

## 2021-04-12 ENCOUNTER — Other Ambulatory Visit: Payer: Self-pay

## 2021-04-12 VITALS — BP 124/73 | HR 98 | Wt 192.6 lb

## 2021-04-12 DIAGNOSIS — Z3491 Encounter for supervision of normal pregnancy, unspecified, first trimester: Secondary | ICD-10-CM | POA: Diagnosis not present

## 2021-04-12 DIAGNOSIS — Z3A31 31 weeks gestation of pregnancy: Secondary | ICD-10-CM

## 2021-04-12 DIAGNOSIS — Z98891 History of uterine scar from previous surgery: Secondary | ICD-10-CM

## 2021-04-12 DIAGNOSIS — O24415 Gestational diabetes mellitus in pregnancy, controlled by oral hypoglycemic drugs: Secondary | ICD-10-CM

## 2021-04-12 DIAGNOSIS — Z23 Encounter for immunization: Secondary | ICD-10-CM | POA: Diagnosis not present

## 2021-04-12 DIAGNOSIS — O24419 Gestational diabetes mellitus in pregnancy, unspecified control: Secondary | ICD-10-CM

## 2021-04-12 MED ORDER — VITAFOL ULTRA 29-0.6-0.4-200 MG PO CAPS: 1.0000 | ORAL_CAPSULE | Freq: Every day | ORAL | 6 refills | Status: DC

## 2021-04-12 MED ORDER — ACCU-CHEK GUIDE VI STRP: ORAL_STRIP | 12 refills | Status: DC

## 2021-04-12 MED ORDER — METFORMIN HCL 500 MG PO TABS: 500.0000 mg | ORAL_TABLET | Freq: Two times a day (BID) | ORAL | 2 refills | Status: DC

## 2021-04-12 NOTE — Progress Notes (Signed)
   PRENATAL VISIT NOTE  Subjective:  Kayla Bradley is a 30 y.o. G2P1001 at [redacted]w[redacted]d being seen today for ongoing prenatal care.  She is currently monitored for the following issues for this high-risk pregnancy and has Thoracic scoliosis; ANA positive; Other fatigue; Asymptomatic varicose veins of both lower extremities; Encounter for supervision of normal pregnancy in first trimester; H/O: C-section; H/O postpartum hemorrhage, currently pregnant; Obesity; History of congenital heart defect; Abnormal weight gain during pregnancy; and Gestational diabetes on their problem list.  Patient reports occasional contractions.  Contractions: Irritability. Vag. Bleeding: None.  Movement: Present. Denies leaking of fluid.   The following portions of the patient's history were reviewed and updated as appropriate: allergies, current medications, past family history, past medical history, past social history, past surgical history and problem list.   Objective:   Vitals:   04/12/21 1421  BP: 124/73  Pulse: 98  Weight: 192 lb 9.6 oz (87.4 kg)    Fetal Status:     Movement: Present     General:  Alert, oriented and cooperative. Patient is in no acute distress.  Skin: Skin is warm and dry. No rash noted.   Cardiovascular: Normal heart rate noted  Respiratory: Normal respiratory effort, no problems with respiration noted  Abdomen: Soft, gravid, appropriate for gestational age.  Pain/Pressure: Present     Pelvic: Cervical exam deferred        Extremities: Normal range of motion.  Edema: None  Mental Status: Normal mood and affect. Normal behavior. Normal judgment and thought content.   Assessment and Plan:  Pregnancy: G2P1001 at [redacted]w[redacted]d 1. Encounter for supervision of normal pregnancy in first trimester, unspecified gravidity --Anticipatory guidance about next visits/weeks of pregnancy given. --Changed to high risk pregnancy today with start of Metformin for GDM  - Prenat-Fe Poly-Methfol-FA-DHA  (VITAFOL ULTRA) 29-0.6-0.4-200 MG CAPS; Take 1 capsule by mouth daily.  Dispense: 30 capsule; Refill: 6 - Tdap vaccine greater than or equal to 7yo IM  2. Gestational diabetes mellitus (GDM) in third trimester, gestational diabetes method of control unspecified --See below  3. History of cesarean section --Desires VBAC, next appt with MD for consent  4. [redacted] weeks gestation of pregnancy  5. Gestational diabetes mellitus (GDM) in third trimester controlled on oral hypoglycemic drug --Reviewed glucose log, pt has made dietary changes and is still having almost all fasting glucose above 95 and PP are 130s-140s. --Start Metformin and appt in 2 weeks with MD - metFORMIN (GLUCOPHAGE) 500 MG tablet; Take 1 tablet (500 mg total) by mouth 2 (two) times daily with a meal.  Dispense: 60 tablet; Refill: 2 - glucose blood (ACCU-CHEK GUIDE) test strip; Use to check blood sugars four times a day was instructed  Dispense: 50 each; Refill: 12  Preterm labor symptoms and general obstetric precautions including but not limited to vaginal bleeding, contractions, leaking of fluid and fetal movement were reviewed in detail with the patient. Please refer to After Visit Summary for other counseling recommendations.   Return in about 2 weeks (around 04/26/2021).  Future Appointments  Date Time Provider Department Center  04/15/2021  2:15 PM [facility]  04/20/2021  3:15 PM [facility] NURSE [facility]  04/20/2021  3:30 PM [facility] US3 [facility]    Sharen Counter, CNM

## 2021-04-12 NOTE — Progress Notes (Signed)
Patient presents for ROB. Patient has no concerns. She forgot to bring her readings today, but states that they have been a little sporadic.She is getting some high and some normal.

## 2021-04-13 ENCOUNTER — Other Ambulatory Visit: Payer: Medicaid Other

## 2021-04-15 ENCOUNTER — Other Ambulatory Visit: Payer: Medicaid Other

## 2021-04-20 ENCOUNTER — Other Ambulatory Visit: Payer: Self-pay | Admitting: Obstetrics and Gynecology

## 2021-04-20 ENCOUNTER — Other Ambulatory Visit: Payer: Self-pay

## 2021-04-20 ENCOUNTER — Ambulatory Visit: Payer: Medicaid Other | Admitting: *Deleted

## 2021-04-20 ENCOUNTER — Ambulatory Visit: Payer: Medicaid Other | Attending: Maternal & Fetal Medicine

## 2021-04-20 VITALS — BP 127/65 | HR 93

## 2021-04-20 DIAGNOSIS — E669 Obesity, unspecified: Secondary | ICD-10-CM

## 2021-04-20 DIAGNOSIS — Z6830 Body mass index (BMI) 30.0-30.9, adult: Secondary | ICD-10-CM | POA: Diagnosis not present

## 2021-04-20 DIAGNOSIS — O2693 Pregnancy related conditions, unspecified, third trimester: Secondary | ICD-10-CM | POA: Insufficient documentation

## 2021-04-20 DIAGNOSIS — Q249 Congenital malformation of heart, unspecified: Secondary | ICD-10-CM

## 2021-04-20 DIAGNOSIS — Z3491 Encounter for supervision of normal pregnancy, unspecified, first trimester: Secondary | ICD-10-CM

## 2021-04-20 DIAGNOSIS — O24410 Gestational diabetes mellitus in pregnancy, diet controlled: Secondary | ICD-10-CM | POA: Diagnosis not present

## 2021-04-20 DIAGNOSIS — Z3A32 32 weeks gestation of pregnancy: Secondary | ICD-10-CM | POA: Diagnosis not present

## 2021-04-20 DIAGNOSIS — O99213 Obesity complicating pregnancy, third trimester: Secondary | ICD-10-CM | POA: Insufficient documentation

## 2021-04-20 DIAGNOSIS — O34219 Maternal care for unspecified type scar from previous cesarean delivery: Secondary | ICD-10-CM

## 2021-04-20 DIAGNOSIS — O24415 Gestational diabetes mellitus in pregnancy, controlled by oral hypoglycemic drugs: Secondary | ICD-10-CM | POA: Diagnosis not present

## 2021-04-20 DIAGNOSIS — O99413 Diseases of the circulatory system complicating pregnancy, third trimester: Secondary | ICD-10-CM

## 2021-04-23 ENCOUNTER — Other Ambulatory Visit: Payer: Self-pay | Admitting: Obstetrics and Gynecology

## 2021-04-23 DIAGNOSIS — O24415 Gestational diabetes mellitus in pregnancy, controlled by oral hypoglycemic drugs: Secondary | ICD-10-CM

## 2021-04-23 DIAGNOSIS — O34219 Maternal care for unspecified type scar from previous cesarean delivery: Secondary | ICD-10-CM

## 2021-04-23 DIAGNOSIS — O99213 Obesity complicating pregnancy, third trimester: Secondary | ICD-10-CM

## 2021-05-05 ENCOUNTER — Ambulatory Visit (INDEPENDENT_AMBULATORY_CARE_PROVIDER_SITE_OTHER): Payer: Medicaid Other | Admitting: Women's Health

## 2021-05-05 ENCOUNTER — Other Ambulatory Visit: Payer: Self-pay

## 2021-05-05 VITALS — BP 129/69 | HR 87 | Wt 195.2 lb

## 2021-05-05 DIAGNOSIS — Z3A34 34 weeks gestation of pregnancy: Secondary | ICD-10-CM

## 2021-05-05 DIAGNOSIS — R768 Other specified abnormal immunological findings in serum: Secondary | ICD-10-CM

## 2021-05-05 DIAGNOSIS — O24419 Gestational diabetes mellitus in pregnancy, unspecified control: Secondary | ICD-10-CM

## 2021-05-05 DIAGNOSIS — E669 Obesity, unspecified: Secondary | ICD-10-CM

## 2021-05-05 DIAGNOSIS — Z3491 Encounter for supervision of normal pregnancy, unspecified, first trimester: Secondary | ICD-10-CM

## 2021-05-05 DIAGNOSIS — Z98891 History of uterine scar from previous surgery: Secondary | ICD-10-CM

## 2021-05-05 DIAGNOSIS — Z6830 Body mass index (BMI) 30.0-30.9, adult: Secondary | ICD-10-CM

## 2021-05-05 DIAGNOSIS — Z8774 Personal history of (corrected) congenital malformations of heart and circulatory system: Secondary | ICD-10-CM

## 2021-05-05 NOTE — Patient Instructions (Addendum)
Maternity Assessment Unit (MAU)  The Maternity Assessment Unit (MAU) is located at the [facility] [place] at [facility]. The address is: [address] [place], [place], [place] [address]. Please see map below for additional directions.    The Maternity Assessment Unit is designed to help you during your pregnancy, and for up to 6 weeks after delivery, with any pregnancy- or postpartum-related emergencies, if you think you are in labor, or if your water has broken. For example, if you experience nausea and vomiting, vaginal bleeding, severe abdominal or pelvic pain, elevated blood pressure or other problems related to your pregnancy or postpartum time, please come to the Maternity Assessment Unit for assistance.       Preterm Labor The normal length of a pregnancy is 39-41 weeks. Preterm labor is when labor starts before 37 completed weeks of pregnancy. Babies who are born prematurely and survive may not be fully developed and may be at an increased risk for long-term problems such as cerebral palsy, developmental delays, and vision andhearing problems. Babies who are born too early may have problems soon after birth. Premature babies may have problems regulating blood sugar, body temperature, heart rate, and breathing rate. These babies often have trouble with feeding. The risk ofhaving problems is highest for babies who are born before 39 weeks of pregnancy. What are the causes? The exact cause of this condition is not known. What increases the risk? You are more likely to have preterm labor if you have certain risk factors that relate to your medical history, problems with present and past pregnancies, andlifestyle factors. Medical history You have abnormalities of the uterus, including a short cervix. You have STIs (sexually transmitted infections) or other infections of the urinary tract and the vagina. You have chronic illnesses, such as blood clotting  problems, diabetes, or high blood pressure. You are overweight or underweight. Present and past pregnancies You have had preterm labor before. You are pregnant with twins or other multiples. You have been diagnosed with a condition in which the placenta covers your cervix (placenta previa). You waited less than 18 months between giving birth and becoming pregnant again. Your unborn baby has some abnormalities. You have vaginal bleeding during pregnancy. You became pregnant through in vitro fertilization (IVF). Lifestyle and environmental factors You use tobacco products or drink alcohol. You use drugs. You have stress and no social support. You experience domestic violence. You are exposed to certain chemicals or environmental pollutants. Other factors You are younger than age 94 or older than age 43. What are the signs or symptoms? Symptoms of this condition include: Cramps similar to those that can happen during a menstrual period. The cramps may happen with diarrhea. Pain in the abdomen or lower back. Regular contractions that may feel like tightening of the abdomen. A feeling of increased pressure in the pelvis. Increased watery or bloody mucus discharge from the vagina. Water breaking (ruptured amniotic sac). How is this diagnosed? This condition is diagnosed based on: Your medical history and a physical exam. A pelvic exam. An ultrasound. Monitoring your uterus for contractions. Other tests, including: A swab of the cervix to check for a chemical called fetal fibronectin. Urine tests. How is this treated? Treatment for this condition depends on the length of your pregnancy, your condition, and the health of your baby. Treatment may include: Taking medicines, such as: Hormone medicines. These may be given early in pregnancy to help support the pregnancy. Medicines to stop contractions. Medicines to  help mature the baby's lungs. These may be prescribed if the risk of  delivery is high. Medicines to help protect your baby from brain and nerve complications such as cerebral palsy. Bed rest. If the labor happens before 34 weeks of pregnancy, you may need to stay in the hospital. Delivery of the baby. Follow these instructions at home:  Do not use any products that contain nicotine or tobacco. These products include cigarettes, chewing tobacco, and vaping devices, such as e-cigarettes. If you need help quitting, ask your health care provider. Do not drink alcohol. Take over-the-counter and prescription medicines only as told by your health care provider. Rest as told by your health care provider. Return to your normal activities as told by your health care provider. Ask your health care provider what activities are safe for you. Keep all follow-up visits. This is important. How is this prevented? To increase your chance of having a full-term pregnancy: Do not use drugs or take medicines that have not been prescribed to you during your pregnancy. Talk with your health care provider before taking any herbal supplements, even if you have been taking them regularly. Make sure you gain a healthy amount of weight during your pregnancy. Watch for infection. If you think that you might have an infection, get it checked right away. Symptoms of infection may include: Fever. Abnormal vaginal discharge or discharge that smells bad. Pain or burning with urination. Needing to urinate urgently. Frequently urinating or passing small amounts of urine frequently. Blood in your urine or urine that smells bad or unusual. Where to find more information U.S. Department of Health and Cytogeneticist on Women's Health: http://hoffman.com/ [employer]ians [employer]necologists: www.acog.org Centers for Disease Control and Prevention, Preterm Birth: FootballExhibition.com.br Contact a health care provider if: You think you are going into preterm labor. You have signs  or symptoms of preterm labor. You have symptoms of infection. Get help right away if: You are having regular, painful contractions every 5 minutes or less. Your water breaks. Summary Preterm labor is labor that starts before you reach 37 weeks of pregnancy. Delivering your baby early increases your baby's risk of developing long-term problems. You are more likely to have preterm labor if you have certain risk factors that relate to your medical history, problems with present and past pregnancies, and lifestyle factors. Keep all follow-up visits. This is important. Contact a health care provider if you have signs or symptoms of preterm labor. This information is not intended to replace advice given to you by your health care provider. Make sure you discuss any questions you have with your healthcare provider. Document Revised: 09/22/2020 Document Reviewed: 09/22/2020 Elsevier Patient Education  2022 Elsevier Inc.                        Safe Medications in Pregnancy    Acne: Benzoyl Peroxide Salicylic Acid  Backache/Headache: Tylenol: 2 regular strength every 4 hours OR              2 Extra strength every 6 hours  Colds/Coughs/Allergies: Benadryl (alcohol free) 25 mg every 6 hours as needed Breath right strips Claritin Cepacol throat lozenges Chloraseptic throat spray Cold-Eeze- up to three times per day Cough drops, alcohol free Flonase (by prescription only) Guaifenesin Mucinex Robitussin DM (plain only, alcohol free) Saline nasal spray/drops Sudafed (pseudoephedrine) & Actifed ** use only after [redacted] weeks gestation and if you do not have high blood pressure Tylenol  Vicks Vaporub Zinc lozenges Zyrtec   Constipation: Colace Ducolax suppositories Fleet enema Glycerin suppositories Metamucil Milk of magnesia Miralax Senokot Smooth move tea  Diarrhea: Kaopectate Imodium A-D  *NO pepto Bismol  Hemorrhoids: Anusol Anusol HC Preparation  H Tucks  Indigestion: Tums Maalox Mylanta Zantac  Pepcid  Insomnia: Benadryl (alcohol free)  every 6 hours as needed Tylenol PM Unisom, no Gelcaps  Leg Cramps: Tums MagGel  Nausea/Vomiting:  Bonine Dramamine Emetrol Ginger extract Sea bands Meclizine  Nausea medication to take during pregnancy:  Unisom (doxylamine succinate 25 mg tablets) Take one tablet daily at bedtime. If symptoms are not adequately controlled, the dose can be increased to a maximum recommended dose of two tablets daily (1/2 tablet in the morning, 1/2 tablet mid-afternoon and one at bedtime). Vitamin B6  tablets. Take one tablet twice a day (up to 200 mg per day).  Skin Rashes: Aveeno products Benadryl cream or  every 6 hours as needed Calamine Lotion 1% cortisone cream  Yeast infection: Gyne-lotrimin 7 Monistat 7   **If taking multiple medications, please check labels to avoid duplicating the same active ingredients **take medication as directed on the label ** Do not exceed 4000 mg of tylenol in 24 hours **Do not take medications that contain aspirin or ibuprofen               Vaginal Birth After Cesarean Delivery  Vaginal birth after cesarean delivery (VBAC) means giving birth vaginally after previously delivering a baby through a cesarean section, or C-section. VBAC maybe a safe option for you, depending on your health and other factors. Discuss VBAC with your health care provider early in your pregnancy, so you can understand the risks, benefits, and options. Having these discussions earlywill give you time to make your birth plan. What increases the chances for a successful VBAC? These factors increase your chances of a successful VBAC: You have had only one previous C-section. You had a low transverse incision for your C-section. You have had a successful vaginal birth. Your labor starts naturally on or before your due date. You and your baby have had a  healthy pregnancy. Your baby is head-down. What happens when I arrive at the birth center or hospital? Once you are in labor and have been admitted into the hospital or birth center, your health care team may: Review your pregnancy history and go over any concerns you have. Talk with you about your birth plan and discuss pain control options. Check your blood pressure, breathing rate, and heart rate. Check your baby's heartbeat. Monitor your uterus for contractions. Check whether your bag of water (amniotic sac) has broken (ruptured). Insert an IV into one of your veins. You may get fluids and medicine through the IV. Monitoring and exams Your health care team may check your contractions (uterine monitoring) and your baby's heart rate (fetal monitoring). You may need to be monitored for long periods at a time (continuously) with a monitoring device. Your health care provider may also do frequent physical exams. These may include checking how and where your baby is positioned in your uterus and checking your cervix to determine whether it is opening up (dilating) or thinning out (effacing). What happens during labor and delivery? Normal labor and delivery are divided into the following three stages: Stage 1 This is the longest stage of labor. Throughout this stage, you will feel contractions. Contractions are generally mild, infrequent, and irregular at first. They get stronger, more frequent (about every 2-3 minutes), and more  regular as you move through this stage. The first stage ends when your cervix is completely dilated to 4 inches (10 cm) and effaced. Stage 2 This stage starts once your cervix is completely dilated and effaced and lasts until you deliver your baby. In this stage, you will feel the urge to push your baby out of your vagina. You may feel stretching and burning pain, especially when the widest part of your baby's head passes through the vaginal opening (crowning). Once  your baby is delivered, the umbilical cord will be clamped and cut. Your baby will be placed on your bare chest and covered with a warm blanket. If you are planning to breastfeed, watch your baby for feeding cues, like rooting or sucking. Stage 3 This stage starts immediately after your baby is born and ends after you deliver the placenta. This stage may take anywhere from 5 to 30 minutes. What can I expect after labor and delivery? After labor is over, you and your baby will be checked to make sure you are both healthy, and you will both be monitored until you are ready to go home. You may be encouraged to stay in the same room with your baby to promote early bonding and successful breastfeeding. Your uterus will be checked and massaged regularly (fundal massage). You may continue to receive fluids and medicines through an IV. You will have some soreness and pain in your abdomen, vagina, and the area of skin between your vaginal opening and your anus (perineum). If an incision was made near your vagina (episiotomy) or if you had some vaginal tearing during delivery, cold compresses may be used to reduce pain and swelling. Follow these instructions at home: Incision care If you have an episiotomy incision, follow instructions from your health care provider about how to take care of your incision. Check your incision area every day for signs of infection. Check for: Redness, swelling, or pain. More fluid or blood. Warmth. Pus or a bad smell. If directed, put ice on the episiotomy area. To do this: Put ice in a plastic bag. Place a towel between your skin and the bag. Leave the ice on for 20 minutes, 2-3 times a day. Remove the ice if your skin turns bright red. This is very important. If you cannot feel pain, heat, or cold, you have a greater risk of damage to the area. Activity Return to your normal activities as told by your health care provider. Ask your health care provider what  activities are safe for you. Avoid sitting for a long time without moving. Get up to take short walks every 1-2 hours. General instructions Take over-the-counter and prescription medicines only as told by your health care provider. Do not take baths, swim, or use a hot tub until your health care provider approves. Ask if you may take showers. You may only be allowed to take sponge baths. It is normal to have vaginal bleeding after delivery. Wear a sanitary pad for vaginal bleeding and discharge. Keep all follow-up visits. This is important. Where to find more information American Pregnancy Association: americanpregnancy.org [employer]stetricians and Gynecologists: acog.org Summary Vaginal birth after cesarean delivery (VBAC) means giving birth vaginally after previously delivering a baby through a cesarean section, or C-section. Once you are in labor and have been admitted into the hospital or birth center, your health care team may review your pregnancy history and go over any concerns you have. Although most women are able to have a successful  VBAC, sometimes it is necessary to have another C-section. Keep all follow-up visits. This is important. This information is not intended to replace advice given to you by your health care provider. Make sure you discuss any questions you have with your healthcare provider. Document Revised: 09/17/2020 Document Reviewed: 09/17/2020 Elsevier Patient Education  2022 [employer].

## 2021-05-05 NOTE — Progress Notes (Signed)
Subjective:  Kayla Bradley is a 30 y.o. G2P1001 at [redacted]w[redacted]d being seen today for ongoing prenatal care.  She is currently monitored for the following issues for this low-risk pregnancy and has Thoracic scoliosis; ANA positive; Asymptomatic varicose veins of both lower extremities; Encounter for supervision of normal pregnancy in first trimester; H/O: C-section; H/O postpartum hemorrhage, currently pregnant; Obesity; History of congenital heart defect; and Gestational diabetes on their problem list.  Patient reports no complaints.  Contractions: Not present. Vag. Bleeding: None.  Movement: Present. Denies leaking of fluid.   The following portions of the patient's history were reviewed and updated as appropriate: allergies, current medications, past family history, past medical history, past social history, past surgical history and problem list. Problem list updated.  Objective:   Vitals:   05/05/21 0918  BP: 129/69  Pulse: 87  Weight: 195 lb 3.2 oz (88.5 kg)    Fetal Status: Fetal Heart Rate (bpm): 135   Movement: Present     General:  Alert, oriented and cooperative. Patient is in no acute distress.  Skin: Skin is warm and dry. No rash noted.   Cardiovascular: Normal heart rate noted  Respiratory: Normal respiratory effort, no problems with respiration noted  Abdomen: Soft, gravid, appropriate for gestational age. Pain/Pressure: Absent     Pelvic: Vag. Bleeding: None     Cervical exam deferred        Extremities: Normal range of motion.  Edema: None  Mental Status: Normal mood and affect. Normal behavior. Normal judgment and thought content.   Urinalysis:      Assessment and Plan:  Pregnancy: G2P1001 at [redacted]w[redacted]d  1. H/O: C-section -VBAC consent not yet signed, will meet with MD next visit  2. Class 1 obesity without serious comorbidity with body mass index (BMI) of 30.0 to 30.9 in adult, unspecified obesity type  3. Encounter for supervision of normal pregnancy in first  trimester, unspecified gravidity -GBS/cultures next visit  4. ANA positive -pt states several years ago she had an entire body rash and "many other symptoms" and was sent to rheumatology and later someone at [facility] for investagation. Patient reports her ANA was borderline high and her WBCs were high. Eventually she was given a long course of high dose ABX and reports symptoms cleared after. Patient reports she has not been seen for f/u since.  5. [redacted] weeks gestation of pregnancy  6. Gestational diabetes mellitus (GDM) in third trimester, gestational diabetes method of control unspecified -BPP scheduled tomorrow -growth [place] 04/20/2021 EFW 92% -pt went on vacation last week and misplaced log, so does not have available for this appointment and does not have good recollection of numbers. Pt advised to keep log for next week and will f/u with MD in one week for discussion of numbers and reevaluation of dose of metformin. Patient is currently taking metformin 500mg  BID.  Preterm labor symptoms and general obstetric precautions including but not limited to vaginal bleeding, contractions, leaking of fluid and fetal movement were reviewed in detail with the patient. I discussed the assessment and treatment plan with the patient. The patient was provided an opportunity to ask questions and all were answered. The patient agreed with the plan and demonstrated an understanding of the instructions. The patient was advised to call back or seek an in-person office evaluation/go to MAU at [facility] for any urgent or concerning symptoms. Please refer to After Visit Summary for other counseling recommendations.  Return in about 1 week (around 05/12/2021) for  MD ONLY/HOB/must see MD to sign VBAC consent.   Mardy Hoppe, Odie Sera, NP

## 2021-05-05 NOTE — Progress Notes (Signed)
Pt reports fetal movement, denies pain.  

## 2021-05-06 ENCOUNTER — Ambulatory Visit: Payer: Medicaid Other | Admitting: *Deleted

## 2021-05-06 ENCOUNTER — Encounter: Payer: Self-pay | Admitting: *Deleted

## 2021-05-06 ENCOUNTER — Ambulatory Visit: Payer: Medicaid Other | Attending: Obstetrics and Gynecology

## 2021-05-06 VITALS — BP 138/65 | HR 92

## 2021-05-06 DIAGNOSIS — Q249 Congenital malformation of heart, unspecified: Secondary | ICD-10-CM

## 2021-05-06 DIAGNOSIS — O99413 Diseases of the circulatory system complicating pregnancy, third trimester: Secondary | ICD-10-CM

## 2021-05-06 DIAGNOSIS — O34219 Maternal care for unspecified type scar from previous cesarean delivery: Secondary | ICD-10-CM

## 2021-05-06 DIAGNOSIS — O99213 Obesity complicating pregnancy, third trimester: Secondary | ICD-10-CM

## 2021-05-06 DIAGNOSIS — E669 Obesity, unspecified: Secondary | ICD-10-CM

## 2021-05-06 DIAGNOSIS — O24415 Gestational diabetes mellitus in pregnancy, controlled by oral hypoglycemic drugs: Secondary | ICD-10-CM

## 2021-05-06 DIAGNOSIS — Z3A35 35 weeks gestation of pregnancy: Secondary | ICD-10-CM | POA: Diagnosis not present

## 2021-05-11 ENCOUNTER — Ambulatory Visit: Payer: Medicaid Other | Admitting: *Deleted

## 2021-05-11 ENCOUNTER — Other Ambulatory Visit: Payer: Self-pay

## 2021-05-11 ENCOUNTER — Encounter: Payer: Self-pay | Admitting: *Deleted

## 2021-05-11 ENCOUNTER — Ambulatory Visit: Payer: Medicaid Other | Attending: Obstetrics and Gynecology

## 2021-05-11 VITALS — BP 140/66 | HR 90

## 2021-05-11 DIAGNOSIS — O99213 Obesity complicating pregnancy, third trimester: Secondary | ICD-10-CM | POA: Insufficient documentation

## 2021-05-11 DIAGNOSIS — E669 Obesity, unspecified: Secondary | ICD-10-CM

## 2021-05-11 DIAGNOSIS — O99413 Diseases of the circulatory system complicating pregnancy, third trimester: Secondary | ICD-10-CM | POA: Diagnosis not present

## 2021-05-11 DIAGNOSIS — Q249 Congenital malformation of heart, unspecified: Secondary | ICD-10-CM | POA: Diagnosis not present

## 2021-05-11 DIAGNOSIS — O24415 Gestational diabetes mellitus in pregnancy, controlled by oral hypoglycemic drugs: Secondary | ICD-10-CM | POA: Diagnosis not present

## 2021-05-11 DIAGNOSIS — Z6830 Body mass index (BMI) 30.0-30.9, adult: Secondary | ICD-10-CM | POA: Diagnosis not present

## 2021-05-11 DIAGNOSIS — O34219 Maternal care for unspecified type scar from previous cesarean delivery: Secondary | ICD-10-CM | POA: Diagnosis not present

## 2021-05-11 DIAGNOSIS — Z3A35 35 weeks gestation of pregnancy: Secondary | ICD-10-CM | POA: Diagnosis not present

## 2021-05-14 ENCOUNTER — Other Ambulatory Visit (HOSPITAL_COMMUNITY)
Admission: RE | Admit: 2021-05-14 | Discharge: 2021-05-14 | Disposition: A | Discharge status: Home / Self Care | Payer: Medicaid Other | Source: Ambulatory Visit | Attending: Obstetrics & Gynecology | Admitting: Obstetrics & Gynecology

## 2021-05-14 ENCOUNTER — Ambulatory Visit (INDEPENDENT_AMBULATORY_CARE_PROVIDER_SITE_OTHER): Payer: Medicaid Other | Admitting: Obstetrics & Gynecology

## 2021-05-14 ENCOUNTER — Other Ambulatory Visit: Payer: Self-pay

## 2021-05-14 VITALS — BP 121/75 | HR 90 | Wt 197.0 lb

## 2021-05-14 DIAGNOSIS — Z3491 Encounter for supervision of normal pregnancy, unspecified, first trimester: Secondary | ICD-10-CM | POA: Insufficient documentation

## 2021-05-14 DIAGNOSIS — Z98891 History of uterine scar from previous surgery: Secondary | ICD-10-CM

## 2021-05-14 DIAGNOSIS — O24419 Gestational diabetes mellitus in pregnancy, unspecified control: Secondary | ICD-10-CM

## 2021-05-14 NOTE — Progress Notes (Signed)
   PRENATAL VISIT NOTE  Subjective:  Kayla Bradley is a 30 y.o. G2P1001 at [redacted]w[redacted]d being seen today for ongoing prenatal care.  She is currently monitored for the following issues for this high-risk pregnancy and has Thoracic scoliosis; ANA positive; Asymptomatic varicose veins of both lower extremities; Encounter for supervision of normal pregnancy in first trimester; H/O: C-section; H/O postpartum hemorrhage, currently pregnant; Obesity; History of congenital heart defect; and Gestational diabetes on their problem list.  Patient reports no complaints.  Contractions: Not present.  .  Movement: Present. Denies leaking of fluid.   The following portions of the patient's history were reviewed and updated as appropriate: allergies, current medications, past family history, past medical history, past social history, past surgical history and problem list.   Objective:   Vitals:   05/14/21 1014  BP: 121/75  Pulse: 90  Weight: 197 lb (89.4 kg)    Fetal Status: Fetal Heart Rate (bpm): 155   Movement: Present  Presentation: Vertex  General:  Alert, oriented and cooperative. Patient is in no acute distress.  Skin: Skin is warm and dry. No rash noted.   Cardiovascular: Normal heart rate noted  Respiratory: Normal respiratory effort, no problems with respiration noted  Abdomen: Soft, gravid, appropriate for gestational age.  Pain/Pressure: Present     Pelvic: Cervical exam performed in the presence of a chaperone Dilation: Fingertip Effacement (%): 10 Station: -3  Extremities: Normal range of motion.     Mental Status: Normal mood and affect. Normal behavior. Normal judgment and thought content.   Assessment and Plan:  Pregnancy: G2P1001 at [redacted]w[redacted]d 1. Gestational diabetes mellitus (GDM) in third trimester, gestational diabetes method of control unspecified FBS some 95-100 o/w good control on metformin  2. H/O: C-section Plans TOLAC. MFM recommended 37-39 week delivery, will schedule at 38  weeks  3. Encounter for supervision of normal pregnancy in first trimester, unspecified gravidity Routine screening - Strep Gp B NAA - Cervicovaginal ancillary only( [place])  Preterm labor symptoms and general obstetric precautions including but not limited to vaginal bleeding, contractions, leaking of fluid and fetal movement were reviewed in detail with the patient. Please refer to After Visit Summary for other counseling recommendations.   Return in about 1 week (around 05/21/2021).  Future Appointments  Date Time Provider Department Center  05/18/2021  1:45 PM [facility] NURSE [facility]  05/18/2021  2:00 PM [facility] US1 [facility]  05/21/2021  9:55 AM Brock Bad, MD [facility] None    Scheryl Darter, MD

## 2021-05-16 LAB — STREP GP B NAA: Strep Gp B NAA: NEGATIVE

## 2021-05-17 LAB — CERVICOVAGINAL ANCILLARY ONLY
Chlamydia: NEGATIVE
Comment: NEGATIVE
Comment: NORMAL
Neisseria Gonorrhea: NEGATIVE

## 2021-05-18 ENCOUNTER — Other Ambulatory Visit: Payer: Self-pay

## 2021-05-18 ENCOUNTER — Ambulatory Visit: Payer: Medicaid Other | Attending: Obstetrics and Gynecology

## 2021-05-18 ENCOUNTER — Encounter: Payer: Self-pay | Admitting: *Deleted

## 2021-05-18 ENCOUNTER — Ambulatory Visit: Payer: Medicaid Other | Admitting: *Deleted

## 2021-05-18 VITALS — BP 136/80 | HR 95

## 2021-05-18 DIAGNOSIS — Z3491 Encounter for supervision of normal pregnancy, unspecified, first trimester: Secondary | ICD-10-CM

## 2021-05-18 DIAGNOSIS — Z362 Encounter for other antenatal screening follow-up: Secondary | ICD-10-CM | POA: Diagnosis not present

## 2021-05-18 DIAGNOSIS — O34219 Maternal care for unspecified type scar from previous cesarean delivery: Secondary | ICD-10-CM | POA: Diagnosis not present

## 2021-05-18 DIAGNOSIS — O24415 Gestational diabetes mellitus in pregnancy, controlled by oral hypoglycemic drugs: Secondary | ICD-10-CM | POA: Diagnosis not present

## 2021-05-18 DIAGNOSIS — O99213 Obesity complicating pregnancy, third trimester: Secondary | ICD-10-CM | POA: Diagnosis not present

## 2021-05-18 DIAGNOSIS — Q249 Congenital malformation of heart, unspecified: Secondary | ICD-10-CM | POA: Diagnosis not present

## 2021-05-18 DIAGNOSIS — E669 Obesity, unspecified: Secondary | ICD-10-CM

## 2021-05-18 DIAGNOSIS — O99413 Diseases of the circulatory system complicating pregnancy, third trimester: Secondary | ICD-10-CM | POA: Diagnosis not present

## 2021-05-18 DIAGNOSIS — Z3A36 36 weeks gestation of pregnancy: Secondary | ICD-10-CM | POA: Diagnosis not present

## 2021-05-19 ENCOUNTER — Other Ambulatory Visit: Payer: Self-pay | Admitting: Advanced Practice Midwife

## 2021-05-19 ENCOUNTER — Telehealth (HOSPITAL_COMMUNITY): Payer: Self-pay | Admitting: *Deleted

## 2021-05-19 NOTE — Telephone Encounter (Signed)
Preadmission screen  

## 2021-05-20 ENCOUNTER — Telehealth (HOSPITAL_COMMUNITY): Payer: Self-pay | Admitting: *Deleted

## 2021-05-20 ENCOUNTER — Encounter (HOSPITAL_COMMUNITY): Payer: Self-pay | Admitting: *Deleted

## 2021-05-20 ENCOUNTER — Encounter (HOSPITAL_COMMUNITY): Payer: Self-pay

## 2021-05-20 NOTE — Telephone Encounter (Signed)
Preadmission screen  

## 2021-05-21 ENCOUNTER — Telehealth (INDEPENDENT_AMBULATORY_CARE_PROVIDER_SITE_OTHER): Payer: Medicaid Other | Admitting: Obstetrics

## 2021-05-21 ENCOUNTER — Encounter: Payer: Self-pay | Admitting: Obstetrics

## 2021-05-21 DIAGNOSIS — Z3A37 37 weeks gestation of pregnancy: Secondary | ICD-10-CM

## 2021-05-21 DIAGNOSIS — O24415 Gestational diabetes mellitus in pregnancy, controlled by oral hypoglycemic drugs: Secondary | ICD-10-CM

## 2021-05-21 DIAGNOSIS — Z98891 History of uterine scar from previous surgery: Secondary | ICD-10-CM

## 2021-05-21 DIAGNOSIS — O34219 Maternal care for unspecified type scar from previous cesarean delivery: Secondary | ICD-10-CM

## 2021-05-21 NOTE — Progress Notes (Signed)
    TELEHEALTH OBSTETRICS VISIT ENCOUNTER NOTE  Provider location: Center for [facility]   Patient location: Home  I connected with Kayla Bradley on 05/21/21 at  9:55 AM EDT by telephone at home and verified that I am speaking with the correct person using two identifiers. Of note, unable to do video encounter due to technical difficulties.    I discussed the limitations, risks, security and privacy concerns of performing an evaluation and management service by telephone and the availability of in person appointments. I also discussed with the patient that there may be a patient responsible charge related to this service. The patient expressed understanding and agreed to proceed.  Subjective:  Kayla Bradley is a 30 y.o. G2P1001 at [redacted]w[redacted]d being followed for ongoing prenatal care.  She is currently monitored for the following issues for this low-risk pregnancy and has Thoracic scoliosis; ANA positive; Asymptomatic varicose veins of both lower extremities; Encounter for supervision of high risk pregnancy in third trimester, antepartum; H/O: C-section; H/O postpartum hemorrhage, currently pregnant; Obesity; History of congenital heart defect; and Gestational diabetes on their problem list.  Patient reports  not feeling well today . Reports fetal movement. Denies any contractions, bleeding or leaking of fluid.   The following portions of the patient's history were reviewed and updated as appropriate: allergies, current medications, past family history, past medical history, past social history, past surgical history and problem list.   Objective:  Last menstrual period 08/27/2020. General:  Alert, oriented and cooperative.   Mental Status: Normal mood and affect perceived. Normal judgment and thought content.  Rest of physical exam deferred due to type of encounter  Assessment and Plan:  Pregnancy: G2P1001 at [redacted]w[redacted]d 1. Gestational diabetes mellitus (GDM) in third  trimester controlled on oral hypoglycemic drug - reports normal glucose control.  Glucose log not available.for numbers  2. H/O: C-section for macrosomia  3. Patient desires vaginal birth after cesarean section (VBAC)   Term labor symptoms and general obstetric precautions including but not limited to vaginal bleeding, contractions, leaking of fluid and fetal movement were reviewed in detail with the patient.  I discussed the assessment and treatment plan with the patient. The patient was provided an opportunity to ask questions and all were answered. The patient agreed with the plan and demonstrated an understanding of the instructions. The patient was advised to call back or seek an in-person office evaluation/go to MAU at [facility] for any urgent or concerning symptoms. Please refer to After Visit Summary for other counseling recommendations.   I have spent a total of 10 minutes non-face-to-face time, excluding clinical staff time, reviewing notes and preparing to see patient, ordering tests and/or medications, and counseling the patient.   Return in about 4 weeks (around 06/18/2021) for postpartum visit.  Future Appointments  Date Time Provider Department Center  05/27/2021  8:25 AM [facility] SCHED ROOM [facility] None    Coral Ceo, MD Center for [facility], [facility] 05/21/21

## 2021-05-21 NOTE — Progress Notes (Signed)
Virtual Visit via Telephone Note  I connected with Kayla Bradley on 05/21/21 at  9:55 AM EDT by telephone and verified that I am speaking with the correct person using two identifiers.  IOL scheduled 05/27/21 GDM readings WNL per pt  Pt not feeling well reqs phone visit

## 2021-05-22 ENCOUNTER — Other Ambulatory Visit: Payer: Self-pay | Admitting: Advanced Practice Midwife

## 2021-05-25 ENCOUNTER — Other Ambulatory Visit: Payer: Self-pay | Admitting: Advanced Practice Midwife

## 2021-05-26 ENCOUNTER — Other Ambulatory Visit: Payer: Self-pay | Admitting: Obstetrics & Gynecology

## 2021-05-26 LAB — SARS CORONAVIRUS 2 (TAT 6-24 HRS): SARS Coronavirus 2: POSITIVE — AB

## 2021-05-26 NOTE — Progress Notes (Signed)
Dr. Debroah Loop notified at 1810 of covid + test result on 05/26/21.

## 2021-05-27 ENCOUNTER — Inpatient Hospital Stay (HOSPITAL_COMMUNITY): Payer: Medicaid Other

## 2021-05-27 ENCOUNTER — Other Ambulatory Visit: Payer: Self-pay

## 2021-05-28 ENCOUNTER — Inpatient Hospital Stay (HOSPITAL_COMMUNITY)
Admission: AD | Admit: 2021-05-28 | Discharge: 2021-06-01 | DRG: 786 | Disposition: A | Discharge status: Home / Self Care | Payer: Medicaid Other | Attending: Obstetrics and Gynecology | Admitting: Obstetrics and Gynecology

## 2021-05-28 ENCOUNTER — Other Ambulatory Visit: Payer: Self-pay

## 2021-05-28 ENCOUNTER — Encounter (HOSPITAL_COMMUNITY): Payer: Self-pay | Admitting: Obstetrics and Gynecology

## 2021-05-28 DIAGNOSIS — Z98891 History of uterine scar from previous surgery: Secondary | ICD-10-CM

## 2021-05-28 DIAGNOSIS — O9081 Anemia of the puerperium: Secondary | ICD-10-CM | POA: Diagnosis not present

## 2021-05-28 DIAGNOSIS — O24419 Gestational diabetes mellitus in pregnancy, unspecified control: Secondary | ICD-10-CM

## 2021-05-28 DIAGNOSIS — O99214 Obesity complicating childbirth: Secondary | ICD-10-CM | POA: Diagnosis not present

## 2021-05-28 DIAGNOSIS — D62 Acute posthemorrhagic anemia: Secondary | ICD-10-CM | POA: Diagnosis not present

## 2021-05-28 DIAGNOSIS — U071 COVID-19: Secondary | ICD-10-CM | POA: Diagnosis present

## 2021-05-28 DIAGNOSIS — O24425 Gestational diabetes mellitus in childbirth, controlled by oral hypoglycemic drugs: Secondary | ICD-10-CM | POA: Diagnosis not present

## 2021-05-28 DIAGNOSIS — O99210 Obesity complicating pregnancy, unspecified trimester: Secondary | ICD-10-CM | POA: Diagnosis present

## 2021-05-28 DIAGNOSIS — O3663X0 Maternal care for excessive fetal growth, third trimester, not applicable or unspecified: Secondary | ICD-10-CM | POA: Diagnosis present

## 2021-05-28 DIAGNOSIS — O9853 Other viral diseases complicating the puerperium: Secondary | ICD-10-CM | POA: Diagnosis present

## 2021-05-28 DIAGNOSIS — O0993 Supervision of high risk pregnancy, unspecified, third trimester: Secondary | ICD-10-CM

## 2021-05-28 DIAGNOSIS — Z87891 Personal history of nicotine dependence: Secondary | ICD-10-CM

## 2021-05-28 DIAGNOSIS — E669 Obesity, unspecified: Secondary | ICD-10-CM | POA: Diagnosis present

## 2021-05-28 DIAGNOSIS — O9852 Other viral diseases complicating childbirth: Secondary | ICD-10-CM | POA: Diagnosis present

## 2021-05-28 DIAGNOSIS — O24424 Gestational diabetes mellitus in childbirth, insulin controlled: Secondary | ICD-10-CM | POA: Diagnosis not present

## 2021-05-28 DIAGNOSIS — Z3A38 38 weeks gestation of pregnancy: Secondary | ICD-10-CM | POA: Diagnosis not present

## 2021-05-28 DIAGNOSIS — O34211 Maternal care for low transverse scar from previous cesarean delivery: Secondary | ICD-10-CM | POA: Diagnosis present

## 2021-05-28 DIAGNOSIS — O09299 Supervision of pregnancy with other poor reproductive or obstetric history, unspecified trimester: Secondary | ICD-10-CM

## 2021-05-28 HISTORY — DX: Ventricular septal defect: Q21.0

## 2021-05-28 LAB — CBC
HCT: 36.9 % (ref 36.0–46.0)
Hemoglobin: 11.6 g/dL — ABNORMAL LOW (ref 12.0–15.0)
MCH: 27 pg (ref 26.0–34.0)
MCHC: 31.4 g/dL (ref 30.0–36.0)
MCV: 86 fL (ref 80.0–100.0)
Platelets: 199 10*3/uL (ref 150–400)
RBC: 4.29 MIL/uL (ref 3.87–5.11)
RDW: 15.5 % (ref 11.5–15.5)
WBC: 9.4 10*3/uL (ref 4.0–10.5)
nRBC: 0 % (ref 0.0–0.2)

## 2021-05-28 LAB — TYPE AND SCREEN
ABO/RH(D): O POS
Antibody Screen: NEGATIVE

## 2021-05-28 LAB — GLUCOSE, CAPILLARY
Glucose-Capillary: 133 mg/dL — ABNORMAL HIGH (ref 70–99)
Glucose-Capillary: 86 mg/dL (ref 70–99)
Glucose-Capillary: 96 mg/dL (ref 70–99)

## 2021-05-28 MED ORDER — OXYCODONE-ACETAMINOPHEN 5-325 MG PO TABS: 1.0000 | ORAL_TABLET | ORAL | Status: DC | PRN

## 2021-05-28 MED ORDER — LACTATED RINGERS IV SOLN: 500.0000 mL | INTRAVENOUS | Status: DC | PRN

## 2021-05-28 MED ORDER — SOD CITRATE-CITRIC ACID 500-334 MG/5ML PO SOLN
30.0000 mL | ORAL | Status: DC | PRN
Start: 2021-05-28 — End: 2021-05-30

## 2021-05-28 MED ORDER — OXYTOCIN-SODIUM CHLORIDE 30-0.9 UT/500ML-% IV SOLN
2.5000 [IU]/h | INTRAVENOUS | Status: DC
  Filled 2021-05-28: qty 500

## 2021-05-28 MED ORDER — TERBUTALINE SULFATE 1 MG/ML IJ SOLN: 0.2500 mg | Freq: Once | INTRAMUSCULAR | Status: DC | PRN

## 2021-05-28 MED ORDER — LIDOCAINE HCL (PF) 1 % IJ SOLN: 30.0000 mL | INTRAMUSCULAR | Status: DC | PRN

## 2021-05-28 MED ORDER — ONDANSETRON HCL 4 MG/2ML IJ SOLN
4.0000 mg | Freq: Four times a day (QID) | INTRAMUSCULAR | Status: DC | PRN
Start: 2021-05-28 — End: 2021-05-30
  Administered 2021-05-29 (×2): 4 mg via INTRAVENOUS
  Filled 2021-05-28 (×3): qty 2

## 2021-05-28 MED ORDER — OXYTOCIN-SODIUM CHLORIDE 30-0.9 UT/500ML-% IV SOLN
1.0000 m[IU]/min | INTRAVENOUS | Status: DC
  Administered 2021-05-29: 30 m[IU]/min via INTRAVENOUS
  Filled 2021-05-28: qty 500

## 2021-05-28 MED ORDER — LACTATED RINGERS IV SOLN: INTRAVENOUS | Status: DC

## 2021-05-28 MED ORDER — OXYTOCIN BOLUS FROM INFUSION: 333.0000 mL | Freq: Once | INTRAVENOUS | Status: DC

## 2021-05-28 MED ORDER — ACETAMINOPHEN 325 MG PO TABS: 650.0000 mg | ORAL_TABLET | ORAL | Status: DC | PRN

## 2021-05-28 MED ORDER — OXYTOCIN-SODIUM CHLORIDE 30-0.9 UT/500ML-% IV SOLN
1.0000 m[IU]/min | INTRAVENOUS | Status: DC
  Administered 2021-05-28: 2 m[IU]/min via INTRAVENOUS

## 2021-05-28 MED ORDER — OXYCODONE-ACETAMINOPHEN 5-325 MG PO TABS: 2.0000 | ORAL_TABLET | ORAL | Status: DC | PRN

## 2021-05-28 MED ORDER — FENTANYL CITRATE (PF) 100 MCG/2ML IJ SOLN
100.0000 ug | INTRAMUSCULAR | Status: DC | PRN
  Administered 2021-05-29: 100 ug via INTRAVENOUS
  Filled 2021-05-28: qty 2

## 2021-05-28 NOTE — Progress Notes (Signed)
Patient ID: Kayla Bradley, female   DOB: 1991/02/29, 29 y.o.   MRN: [id number] Doing well, comfortable  Vitals:   05/28/21 1732 05/28/21 1803 05/28/21 1827 05/28/21 1856  BP: 135/72 (!) 131/54 122/69 (!) 121/59  Pulse: 92 82 (!) 120 92  Resp: 16 20 20 16   Temp:      TempSrc:      Weight:      Height:        FHR reacctive UCs irregular  Cervix deferred  Will place foley when we can

## 2021-05-28 NOTE — H&P (Signed)
OBSTETRIC ADMISSION HISTORY AND PHYSICAL  Kayla Bradley is a 30 y.o. female G2P1001 with IUP at 21w1dby 9 week [place] She reports +FMs, no LOF, no VB, no blurry vision, headaches, peripheral edema, or RUQ pain.  She plans on breast feeding. She is undecided about birth control postpartum.  [facility]    Dating: By [place]--->  Estimated Date of Delivery: 06/10/21  Sono:   05/18/21 '@[redacted]w[redacted]d' , CWD, normal anatomy, cephalic presentation, anterior placental lie, 4065 g, > 99% EFW  Prenatal History/Complications:  Hx of C-section for macrosomia  Hx of postpartum hemorrhage Obesity (BMI 36) A2GDM on Metformin  Past Medical History: Past Medical History:  Diagnosis Date   Dental infection    Dyspnea    Gestational diabetes    Postpartum hemorrhage    VSD (ventricular septal defect)    repaired by surgery at 30yrold    Past Surgical History: Past Surgical History:  Procedure Laterality Date   CESAREAN SECTION     heart defect fixed     VSD REPAIR      Obstetrical History: OB History     Gravida  2   Para  1   Term  1   Preterm      AB      Living  1      SAB      IAB      Ectopic      Multiple      Live Births  1           Social History Social History   Socioeconomic History   Marital status: Married    Spouse name: Not on file   Number of children: 1   Years of education: Not on file   Highest education level: Not on file  Occupational History   Occupation: Host    Comment: [employer], research (life sciences) Occupation: UNEMPLOYED  Tobacco Use   Smoking status: Former    Types: Cigarettes   Smokeless tobacco: Never  VaScientific laboratory technicianse: Never used  Substance and Sexual Activity   Alcohol use: Not Currently    Alcohol/week: 1.0 standard drink    Types: 1 Cans of beer per week   Drug use: Not Currently    Frequency: 7.0 times per week    Types: Marijuana    Comment: 2 years ago   Sexual  activity: Yes    Partners: Male    Birth control/protection: None  Other Topics Concern   Not on file  Social History Narrative   Not on file   Social Determinants of Health   Financial Resource Strain: Not on file  Food Insecurity: Not on file  Transportation Needs: Not on file  Physical Activity: Not on file  Stress: Not on file  Social Connections: Not on file    Family History: Family History  Problem Relation Age of Onset   Diabetes Father    Diabetes Sister    Heart disease Maternal Grandfather     Allergies: Allergies  Allergen Reactions   Escitalopram Other (See Comments)    Made patient feel crazy    Medications Prior to Admission  Medication Sig Dispense Refill Last Dose   Accu-Chek Softclix Lancets lancets Check blood sugars four times a day. 100 each 1 05/27/2021   aspirin EC 81 MG tablet Take 1 tablet (81 mg total) by mouth daily. Take after 12 weeks for  prevention of preeclampsia later in pregnancy 300 tablet 2 Past Month   Blood Glucose Monitoring Suppl (ACCU-CHEK GUIDE ME) w/Device KIT See admin instructions.   05/27/2021   Blood Pressure Monitoring (BLOOD PRESSURE KIT) DEVI 1 kit by Does not apply route once a week. 1 each 0 Past Week   glucose blood (ACCU-CHEK GUIDE) test strip Use to check blood sugars four times a day was instructed 50 each 12 05/27/2021   metFORMIN (GLUCOPHAGE) 500 MG tablet Take 1 tablet (500 mg total) by mouth 2 (two) times daily with a meal. 60 tablet 2 05/27/2021   Prenat-Fe Poly-Methfol-FA-DHA (VITAFOL ULTRA) 29-0.6-0.4-200 MG CAPS Take 1 capsule by mouth daily. 30 capsule 6 05/27/2021   Prenatal Vit-Fe Phos-FA-Omega (VITAFOL GUMMIES) 3.33-0.333-34.8 MG CHEW Chew 3 tablets by mouth daily. (Patient not taking: No sig reported) 90 tablet 11      Review of Systems  All systems reviewed and negative except as stated in HPI  Blood pressure 127/70, pulse 92, temperature 98.1 F (36.7 C), temperature source Oral, resp. rate 20, height  '5\' 2"'  (1.575 m), weight 89.8 kg, last menstrual period 08/27/2020.  General appearance: alert, cooperative, and no distress Lungs: normal work of breathing on room air Heart: normal rate Abdomen: soft, gravid, nontender  Extremities: no LE edema  Presentation: cephalic  Fetal monitoring: Baseline 150 bpm, moderate variability, + accels, no decels  Uterine activity: Irregular contractions  Dilation: Closed Effacement (%): Thick Station: -3 Exam by:: foley,rn  Prenatal labs: ABO, Rh: --/--/PENDING (08/26 1506) Antibody: PENDING (08/26 1506) Rubella: 2.67 (02/15 1433) RPR: Non Reactive (06/23 1022)  HBsAg: Negative (02/15 1433)  HIV: Non Reactive (06/23 1022)  GBS: Negative/-- (08/12 1103)  1 hr Glucola abnormal - GDM on Metformin Genetic screening low risk female; negative AFP Anatomy US normal  Prenatal Transfer Tool  Maternal Diabetes: Yes:  Diabetes Type:  Insulin/Medication controlled Genetic Screening: Normal Maternal Ultrasounds/Referrals: Normal Fetal Ultrasounds or other Referrals:  Referred to Materal Fetal Medicine  Maternal Substance Abuse:  No Significant Maternal Medications:  Meds include: Other: Metformin 500 mg BID, ASA 81 mg daily  Significant Maternal Lab Results: Group B Strep negative  Results for orders placed or performed during the hospital encounter of 05/28/21 (from the past 24 hour(s))  Glucose, capillary   Collection Time: 05/28/21  2:34 PM  Result Value Ref Range   Glucose-Capillary 133 (H) 70 - 99 mg/dL  Type and screen   Collection Time: 05/28/21  3:06 PM  Result Value Ref Range   ABO/RH(D) PENDING    Antibody Screen PENDING    Sample Expiration      05/31/2021,2359 Performed at [facility], [place] [address], [place],  [address]     Patient Active Problem List   Diagnosis Date Noted   Gestational diabetes mellitus, class A2 05/28/2021   Gestational diabetes 03/26/2021   H/O: C-section 11/17/2020   H/O postpartum  hemorrhage, currently pregnant 11/17/2020   Obesity 11/17/2020   History of congenital heart defect 11/17/2020   Encounter for supervision of high risk pregnancy in third trimester, antepartum 11/11/2020   Asymptomatic varicose veins of both lower extremities 08/10/2020   ANA positive 11/22/2016   Thoracic scoliosis 08/08/2012    Assessment/Plan:  JESSAMYN WATTERSON is a 30 y.o. G2P1001 at 17w1dhere for IOL due to A2GDM and LGA.   #Labor: SVE close/thick/-3. Will start with Pitocin 2x2 and attempt to place FB/Cook's catheter as able.  #Pain:  PRN; planning for epidural #FWB:  Cat 1 #ID:   GBS neg #MOF:  Breast #MOC:  Undecided; planning for temporarily method to bridge to partner vasectomy   A2GDM: Initial glucose 133 though patient ate before arrival. Will plan for Q4hr glucose checks and consider insulin PRN.   LGA: EFW 4065 g at [redacted]w[redacted]d TOLAC: Hx of primary CS for fetal macrosomia. TOLAC consent reviewed with patient on admission. Counseled on risks associated with TOLAC. Patient agrees to proceed with induction. All questions and concerns addressed.   Hx of PPH: Plan for TXA at delivery  CGenia Del MD  [facility] Surgery CenterFellow  Faculty Practice 05/28/2021, 4:09 PM

## 2021-05-29 ENCOUNTER — Inpatient Hospital Stay (HOSPITAL_COMMUNITY): Payer: Medicaid Other | Admitting: Anesthesiology

## 2021-05-29 LAB — GLUCOSE, CAPILLARY
Glucose-Capillary: 102 mg/dL — ABNORMAL HIGH (ref 70–99)
Glucose-Capillary: 105 mg/dL — ABNORMAL HIGH (ref 70–99)
Glucose-Capillary: 104 mg/dL — ABNORMAL HIGH (ref 70–99)
Glucose-Capillary: 97 mg/dL (ref 70–99)
Glucose-Capillary: 87 mg/dL (ref 70–99)

## 2021-05-29 LAB — RPR: RPR Ser Ql: NONREACTIVE

## 2021-05-29 MED ORDER — DIPHENHYDRAMINE HCL 50 MG/ML IJ SOLN: 12.5000 mg | INTRAMUSCULAR | Status: DC | PRN

## 2021-05-29 MED ORDER — PHENYLEPHRINE 40 MCG/ML (10ML) SYRINGE FOR IV PUSH (FOR BLOOD PRESSURE SUPPORT): 80.0000 ug | PREFILLED_SYRINGE | INTRAVENOUS | Status: DC | PRN

## 2021-05-29 MED ORDER — EPHEDRINE 5 MG/ML INJ: 10.0000 mg | INTRAVENOUS | Status: DC | PRN

## 2021-05-29 MED ORDER — LIDOCAINE HCL (PF) 1 % IJ SOLN
INTRAMUSCULAR | Status: DC | PRN
  Administered 2021-05-29: 11 mL via EPIDURAL

## 2021-05-29 MED ORDER — FENTANYL-BUPIVACAINE-NACL 0.5-0.125-0.9 MG/250ML-% EP SOLN
12.0000 mL/h | EPIDURAL | Status: DC | PRN
  Administered 2021-05-29 (×2): 12 mL/h via EPIDURAL
  Filled 2021-05-29 (×2): qty 250

## 2021-05-29 MED ORDER — LACTATED RINGERS IV SOLN
500.0000 mL | Freq: Once | INTRAVENOUS | Status: AC
  Administered 2021-05-29: 500 mL via INTRAVENOUS

## 2021-05-29 NOTE — Anesthesia Preprocedure Evaluation (Addendum)
Anesthesia Evaluation  Patient identified by MRN, date of birth, ID band Patient awake    Reviewed: Allergy & Precautions, NPO status , Patient's Chart, lab work & pertinent test results  Airway Mallampati: II  TM Distance: >3 FB Neck ROM: Full    Dental   Pulmonary former smoker,    Pulmonary exam normal        Cardiovascular Normal cardiovascular exam   Hx VSD, repaired at 30 y.o.    Neuro/Psych negative neurological ROS  negative psych ROS   GI/Hepatic negative GI ROS, Neg liver ROS,   Endo/Other  diabetes, Gestational Obesity   Renal/GU negative Renal ROS     Musculoskeletal negative musculoskeletal ROS (+)   Abdominal (+) + obese,   Peds  Hematology negative hematology ROS (+)   Anesthesia Other Findings Covid+ 8/24, asymptomatic   Reproductive/Obstetrics (+) Pregnancy  Hx PPH                             Anesthesia Physical Anesthesia Plan  ASA: 2  Anesthesia Plan: Epidural   Post-op Pain Management:    Induction:   PONV Risk Score and Plan: 2 and Treatment may vary due to age or medical condition  Airway Management Planned: Natural Airway  Additional Equipment: None  Intra-op Plan:   Post-operative Plan:   Informed Consent: I have reviewed the patients History and Physical, chart, labs and discussed the procedure including the risks, benefits and alternatives for the proposed anesthesia with the patient or authorized representative who has indicated his/her understanding and acceptance.     Dental advisory given  Plan Discussed with: CRNA and Anesthesiologist  Anesthesia Plan Comments: (Epidural functioning well for labor, plan to use for c-section )       Anesthesia Quick Evaluation

## 2021-05-29 NOTE — Progress Notes (Signed)
Kayla Bradley is a 30 y.o. G2P1001 at [redacted]w[redacted]d admitted for induction of labor due to A2GDM.  Subjective: Patient is comfortable.  Objective: BP 133/64   Pulse 84   Temp 98.3 F (36.8 C) (Oral)   Resp 16   Ht 5\' 2"  (1.575 m)   Wt 89.8 kg   LMP 08/27/2020 (Approximate)   SpO2 99%   BMI 36.21 kg/m  No intake/output data recorded. No intake/output data recorded.  FHT:  FHR: 140 bpm, variability: moderate,  accelerations:  Present,  decelerations:  Absent UC:   regular, every 4 minutes SVE:   Dilation: 5 Effacement (%): 90 Station: -1 Exam by:: Polos RN  Labs: Lab Results  Component Value Date   WBC 9.4 05/28/2021   HGB 11.6 (L) 05/28/2021   HCT 36.9 05/28/2021   MCV 86.0 05/28/2021   PLT 199 05/28/2021    Assessment / Plan: Induction of labor due to gestational diabetes.  Labor: FB out, pitocin at 85mu/min. AROM at 1122 AM. Will continue to hold pitocin here. Will plan to place IUPC. Preeclampsia:  Bps elevated 130-140s/50s-60s. Most recent WNL at 133/64. Asymptomatic. A2GDM: q4h CBG, last CBG 104. Fetal Wellbeing:  Category I Pain Control:  Epidural I/D:   GBS neg Anticipated MOD:  NSVD  31m 05/29/2021, 3:33 PM

## 2021-05-29 NOTE — Anesthesia Procedure Notes (Signed)
Epidural Patient location during procedure: OB Start time: 05/29/2021 2:47 AM End time: 05/29/2021 3:05 AM  Staffing Anesthesiologist: Lowella Curb, MD Performed: anesthesiologist   Preanesthetic Checklist Completed: patient identified, IV checked, site marked, risks and benefits discussed, surgical consent, monitors and equipment checked, pre-op evaluation and timeout performed  Epidural Patient position: sitting Prep: ChloraPrep Patient monitoring: heart rate, cardiac monitor, continuous pulse ox and blood pressure Approach: midline Location: L2-L3 Injection technique: LOR saline  Needle:  Needle type: Tuohy  Needle gauge: 17 G Needle length: 9 cm Needle insertion depth: 5 cm Catheter type: closed end flexible Catheter size: 20 Guage Catheter at skin depth: 9 cm Test dose: negative  Assessment Events: blood not aspirated, injection not painful, no injection resistance, no paresthesia and negative IV test  Additional Notes Reason for block:procedure for pain

## 2021-05-29 NOTE — Progress Notes (Signed)
Kayla Bradley is a 30 y.o. G2P1001 at [redacted]w[redacted]d admitted for induction of labor due to A2GDM.  Subjective: Patient more comfortable, improved nausea.  Objective: BP (!) 118/52   Pulse 75   Temp 97.8 F (36.6 C) (Oral)   Resp 17   Ht 5\' 2"  (1.575 m)   Wt 89.8 kg   LMP 08/27/2020 (Approximate)   SpO2 99%   BMI 36.21 kg/m  No intake/output data recorded. No intake/output data recorded.  FHT:  FHR: 140 bpm, variability: moderate,  accelerations:  Present,  decelerations:  Absent UC:   regular, every 4 minutes SVE:   Dilation: 4.5 Effacement (%): 70 Station: -1 Exam by:: Denetra Formoso MD  Labs: Lab Results  Component Value Date   WBC 9.4 05/28/2021   HGB 11.6 (L) 05/28/2021   HCT 36.9 05/28/2021   MCV 86.0 05/28/2021   PLT 199 05/28/2021    Assessment / Plan: Induction of labor due to gestational diabetes,  progressing well on pitocin  Labor: FB out, pitocin at 54mu/min. AROM now for clear. Continue to titrate pitocin. Preeclampsia:  Bps elevated 130-140s/50s-60s. Most recent WNL at 118/52. Asymptomatic. A2GDM: q4h CBG, last CBG 105. Fetal Wellbeing:  Category I Pain Control:  Epidural I/D:   GBS neg Anticipated MOD:  NSVD  26m 05/29/2021, 11:39 AM

## 2021-05-29 NOTE — Progress Notes (Signed)
Pt called out c/o anxiety after waking up numb.  She reports that she could not feel her legs and that made her feel panicked.  VS WNL.  Pt sat up and anest MD notified.

## 2021-05-29 NOTE — Progress Notes (Signed)
Kayla Bradley is a 30 y.o. G2P1001 at [redacted]w[redacted]d admitted for induction of labor due to A2GDM.  Subjective: Patient is umcomfortable, having lower left back pain.  Objective: BP 134/61   Pulse 84   Temp 98.3 F (36.8 C) (Oral)   Resp 16   Ht 5\' 2"  (1.575 m)   Wt 89.8 kg   LMP 08/27/2020 (Approximate)   SpO2 99%   BMI 36.21 kg/m  No intake/output data recorded. No intake/output data recorded.  FHT:  FHR: 130 bpm, variability: moderate,  accelerations:  Present,  decelerations:  Absent UC:   regular, every 3 minutes SVE:   Dilation: 5 Effacement (%): 90 Station: -1 Exam by:: Polos RN  Labs: Lab Results  Component Value Date   WBC 9.4 05/28/2021   HGB 11.6 (L) 05/28/2021   HCT 36.9 05/28/2021   MCV 86.0 05/28/2021   PLT 199 05/28/2021    Assessment / Plan: Induction of labor due to gestational diabetes.  Labor: FB out, s/p AROM. Pitocin held at 30 mu/min for 5 hours, no change in dilation. Will do pit break for 4 hours, then restart with IUPC. Preeclampsia:  Bps elevated earlier 130-140s/50s-60s. Most recent WNL at 133/69. Asymptomatic. A2GDM: q4h CBG, last CBG 97. Fetal Wellbeing:  Category I Pain Control:  Epidural I/D:   GBS neg Anticipated MOD:  NSVD  05/30/2021 05/29/2021, 6:16 PM

## 2021-05-29 NOTE — Progress Notes (Signed)
Patient ID: Kayla Bradley, female   DOB: [birth year], 30 y.o.   MRN: [id number] Doing well  Vitals:   05/28/21 1856 05/28/21 1942 05/28/21 2308 05/28/21 2314  BP: (!) 121/59 125/63 (!) 128/57 (!) 131/59  Pulse: 92 89 80 83  Resp: 16 16 16 18   Temp:    98.6 F (37 C)  TempSrc:    Oral  Weight:      Height:       FHR reactive UCs q64min  Dilation: 1 Effacement (%): 70, 80 Cervical Position: Middle Station: -2, -3 Presentation: Vertex Exam by:: Zakee Deerman CNM  Foley bulb inserted

## 2021-05-29 NOTE — Progress Notes (Addendum)
Kayla Bradley is a 30 y.o. G2P1001 at [redacted]w[redacted]d admitted for induction of labor due to A2GDM.  Subjective: Patient woke up disoriented and numb, was a little panicked, now feeling better and calmer. Very nauseated.  Objective: BP 135/60   Pulse 80   Temp 97.9 F (36.6 C) (Oral)   Resp 19   Ht 5\' 2"  (1.575 m)   Wt 89.8 kg   LMP 08/27/2020 (Approximate)   SpO2 99%   BMI 36.21 kg/m  No intake/output data recorded. No intake/output data recorded.  FHT:  FHR: 140 bpm, variability: moderate,  accelerations:  Present,  decelerations:  Absent UC:   regular, every 4 minutes SVE:   Dilation: 1 Effacement (%): 70, 80 Station: -2, -3 Exam by:: [email or web address]  Labs: Lab Results  Component Value Date   WBC 9.4 05/28/2021   HGB 11.6 (L) 05/28/2021   HCT 36.9 05/28/2021   MCV 86.0 05/28/2021   PLT 199 05/28/2021    Assessment / Plan: Induction of labor due to gestational diabetes,  progressing well on pitocin  Labor:  FB out, pitocin running. Patient would like zofran for nausea and then will be checked by nurse. Will progress to AROM when possible.  Preeclampsia:  Bps elevated 130-140s/50s-60s. Asymptomatic. A2GDM: q4h CBG, last CBG 105. Fetal Wellbeing:  Category I Pain Control:  Epidural I/D:   GBS neg Anticipated MOD:  NSVD  05/30/2021 05/29/2021, 9:39 AM

## 2021-05-29 NOTE — Progress Notes (Signed)
Patient ID: Kayla Bradley, female   DOB: 1991/04/03, 30 y.o.   MRN: [id number] Doing well but requesting epidural for pain  Vitals:   05/28/21 2314 05/29/21 0112 05/29/21 0125 05/29/21 0131  BP: (!) 131/59 (!) 141/83 134/66 131/66  Pulse: 83 88 90 84  Resp: 18 18 18 18   Temp: 98.6 F (37 C)  97.7 F (36.5 C)   TempSrc: Oral  Oral   Weight:      Height:       FHR reassuring UCs 

## 2021-05-30 ENCOUNTER — Encounter (HOSPITAL_COMMUNITY): Payer: Self-pay | Admitting: Obstetrics and Gynecology

## 2021-05-30 ENCOUNTER — Encounter (HOSPITAL_COMMUNITY)
Admission: AD | Disposition: A | Discharge status: Home / Self Care | Payer: Self-pay | Source: Home / Self Care | Attending: Obstetrics and Gynecology

## 2021-05-30 DIAGNOSIS — U071 COVID-19: Secondary | ICD-10-CM

## 2021-05-30 DIAGNOSIS — Z3A38 38 weeks gestation of pregnancy: Secondary | ICD-10-CM

## 2021-05-30 DIAGNOSIS — O24424 Gestational diabetes mellitus in childbirth, insulin controlled: Secondary | ICD-10-CM

## 2021-05-30 DIAGNOSIS — O3663X0 Maternal care for excessive fetal growth, third trimester, not applicable or unspecified: Secondary | ICD-10-CM

## 2021-05-30 DIAGNOSIS — O9852 Other viral diseases complicating childbirth: Secondary | ICD-10-CM

## 2021-05-30 DIAGNOSIS — O34211 Maternal care for low transverse scar from previous cesarean delivery: Secondary | ICD-10-CM

## 2021-05-30 LAB — CBC
HCT: 29 % — ABNORMAL LOW (ref 36.0–46.0)
Hemoglobin: 9.1 g/dL — ABNORMAL LOW (ref 12.0–15.0)
MCH: 27.2 pg (ref 26.0–34.0)
MCHC: 31.4 g/dL (ref 30.0–36.0)
MCV: 86.6 fL (ref 80.0–100.0)
Platelets: 197 10*3/uL (ref 150–400)
RBC: 3.35 MIL/uL — ABNORMAL LOW (ref 3.87–5.11)
RDW: 15.6 % — ABNORMAL HIGH (ref 11.5–15.5)
WBC: 20.3 10*3/uL — ABNORMAL HIGH (ref 4.0–10.5)
nRBC: 0 % (ref 0.0–0.2)

## 2021-05-30 LAB — GLUCOSE, CAPILLARY: Glucose-Capillary: 125 mg/dL — ABNORMAL HIGH (ref 70–99)

## 2021-05-30 SURGERY — Surgical Case
Anesthesia: Epidural | Laterality: Bilateral

## 2021-05-30 MED ORDER — MORPHINE SULFATE (PF) 0.5 MG/ML IJ SOLN
INTRAMUSCULAR | Status: DC | PRN
  Administered 2021-05-30: 3 mg via EPIDURAL

## 2021-05-30 MED ORDER — LIDOCAINE-EPINEPHRINE (PF) 2 %-1:200000 IJ SOLN
INTRAMUSCULAR | Status: DC | PRN
  Administered 2021-05-30: 3 mL via EPIDURAL
  Administered 2021-05-30 (×3): 5 mL via EPIDURAL

## 2021-05-30 MED ORDER — STERILE WATER FOR IRRIGATION IR SOLN
Status: DC | PRN
  Administered 2021-05-30: 1

## 2021-05-30 MED ORDER — TRANEXAMIC ACID-NACL 1000-0.7 MG/100ML-% IV SOLN
INTRAVENOUS | Status: DC | PRN
  Administered 2021-05-30: 1000 mg via INTRAVENOUS

## 2021-05-30 MED ORDER — COCONUT OIL OIL: 1.0000 "application " | TOPICAL_OIL | Status: DC | PRN

## 2021-05-30 MED ORDER — FENTANYL CITRATE (PF) 100 MCG/2ML IJ SOLN
25.0000 ug | INTRAMUSCULAR | Status: DC | PRN
  Administered 2021-05-30: 50 ug via INTRAVENOUS

## 2021-05-30 MED ORDER — SODIUM CHLORIDE 0.9 % IV SOLN
500.0000 mg | INTRAVENOUS | Status: AC
  Administered 2021-05-30: 500 mg via INTRAVENOUS

## 2021-05-30 MED ORDER — DIBUCAINE (PERIANAL) 1 % EX OINT: 1.0000 "application " | TOPICAL_OINTMENT | CUTANEOUS | Status: DC | PRN

## 2021-05-30 MED ORDER — ACETAMINOPHEN 10 MG/ML IV SOLN
INTRAVENOUS | Status: AC
  Filled 2021-05-30: qty 100

## 2021-05-30 MED ORDER — PRENATAL MULTIVITAMIN CH
1.0000 | ORAL_TABLET | Freq: Every day | ORAL | Status: DC
  Administered 2021-05-30 – 2021-06-01 (×3): 1 via ORAL
  Filled 2021-05-30 (×3): qty 1

## 2021-05-30 MED ORDER — FENTANYL CITRATE (PF) 100 MCG/2ML IJ SOLN
INTRAMUSCULAR | Status: AC
  Filled 2021-05-30: qty 2

## 2021-05-30 MED ORDER — OXYCODONE HCL 5 MG/5ML PO SOLN: 5.0000 mg | Freq: Once | ORAL | Status: DC | PRN

## 2021-05-30 MED ORDER — ACETAMINOPHEN 10 MG/ML IV SOLN
1000.0000 mg | Freq: Once | INTRAVENOUS | Status: AC
  Administered 2021-05-30: 1000 mg via INTRAVENOUS

## 2021-05-30 MED ORDER — OXYCODONE HCL 5 MG PO TABS
5.0000 mg | ORAL_TABLET | ORAL | Status: DC | PRN
  Administered 2021-05-31 – 2021-06-01 (×2): 5 mg via ORAL
  Administered 2021-06-01: 10 mg via ORAL
  Administered 2021-06-01: 5 mg via ORAL
  Filled 2021-05-30: qty 2
  Filled 2021-05-30 (×3): qty 1

## 2021-05-30 MED ORDER — SODIUM CHLORIDE 0.9 % IR SOLN
Status: DC | PRN
  Administered 2021-05-30: 1

## 2021-05-30 MED ORDER — ENOXAPARIN SODIUM 40 MG/0.4ML IJ SOSY
40.0000 mg | PREFILLED_SYRINGE | INTRAMUSCULAR | Status: DC
  Administered 2021-05-30 – 2021-05-31 (×2): 40 mg via SUBCUTANEOUS
  Filled 2021-05-30 (×2): qty 0.4

## 2021-05-30 MED ORDER — FENTANYL CITRATE (PF) 100 MCG/2ML IJ SOLN
INTRAMUSCULAR | Status: DC | PRN
  Administered 2021-05-30: 50 ug via INTRAVENOUS

## 2021-05-30 MED ORDER — CARBOPROST TROMETHAMINE 250 MCG/ML IM SOLN
INTRAMUSCULAR | Status: DC | PRN
  Administered 2021-05-30: 250 ug via INTRAMUSCULAR

## 2021-05-30 MED ORDER — CEFAZOLIN SODIUM-DEXTROSE 2-4 GM/100ML-% IV SOLN
INTRAVENOUS | Status: AC
  Filled 2021-05-30: qty 100

## 2021-05-30 MED ORDER — DIPHENHYDRAMINE HCL 25 MG PO CAPS
25.0000 mg | ORAL_CAPSULE | ORAL | Status: DC | PRN
  Administered 2021-05-31: 25 mg via ORAL
  Filled 2021-05-30: qty 1

## 2021-05-30 MED ORDER — NALBUPHINE HCL 10 MG/ML IJ SOLN: 5.0000 mg | Freq: Once | INTRAMUSCULAR | Status: DC | PRN

## 2021-05-30 MED ORDER — SIMETHICONE 80 MG PO CHEW: 80.0000 mg | CHEWABLE_TABLET | ORAL | Status: DC | PRN

## 2021-05-30 MED ORDER — SENNOSIDES-DOCUSATE SODIUM 8.6-50 MG PO TABS
2.0000 | ORAL_TABLET | Freq: Every day | ORAL | Status: DC
  Administered 2021-05-31 – 2021-06-01 (×2): 2 via ORAL
  Filled 2021-05-30 (×2): qty 2

## 2021-05-30 MED ORDER — ONDANSETRON HCL 4 MG/2ML IJ SOLN
INTRAMUSCULAR | Status: DC | PRN
  Administered 2021-05-30: 4 mg via INTRAVENOUS

## 2021-05-30 MED ORDER — OXYTOCIN-SODIUM CHLORIDE 30-0.9 UT/500ML-% IV SOLN
INTRAVENOUS | Status: DC | PRN
  Administered 2021-05-30: 400 mL via INTRAVENOUS

## 2021-05-30 MED ORDER — PROMETHAZINE HCL 25 MG/ML IJ SOLN: 6.2500 mg | INTRAMUSCULAR | Status: DC | PRN

## 2021-05-30 MED ORDER — OXYCODONE HCL 5 MG PO TABS: 5.0000 mg | ORAL_TABLET | Freq: Once | ORAL | Status: DC | PRN

## 2021-05-30 MED ORDER — DIPHENHYDRAMINE HCL 50 MG/ML IJ SOLN: 12.5000 mg | INTRAMUSCULAR | Status: DC | PRN

## 2021-05-30 MED ORDER — DEXTROSE 5 % IV SOLN
1.0000 ug/kg/h | INTRAVENOUS | Status: DC | PRN
  Filled 2021-05-30: qty 5

## 2021-05-30 MED ORDER — OXYTOCIN-SODIUM CHLORIDE 30-0.9 UT/500ML-% IV SOLN
INTRAVENOUS | Status: AC
  Filled 2021-05-30: qty 500

## 2021-05-30 MED ORDER — TRANEXAMIC ACID-NACL 1000-0.7 MG/100ML-% IV SOLN
INTRAVENOUS | Status: AC
  Filled 2021-05-30: qty 100

## 2021-05-30 MED ORDER — KETOROLAC TROMETHAMINE 30 MG/ML IJ SOLN: 30.0000 mg | Freq: Four times a day (QID) | INTRAMUSCULAR | Status: AC | PRN

## 2021-05-30 MED ORDER — DEXMEDETOMIDINE (PRECEDEX) IN NS 20 MCG/5ML (4 MCG/ML) IV SYRINGE
PREFILLED_SYRINGE | INTRAVENOUS | Status: DC | PRN
  Administered 2021-05-30: 4 ug via INTRAVENOUS
  Administered 2021-05-30: 8 ug via INTRAVENOUS
  Administered 2021-05-30 (×2): 4 ug via INTRAVENOUS

## 2021-05-30 MED ORDER — ONDANSETRON HCL 4 MG/2ML IJ SOLN
INTRAMUSCULAR | Status: AC
  Filled 2021-05-30: qty 2

## 2021-05-30 MED ORDER — DEXAMETHASONE SODIUM PHOSPHATE 4 MG/ML IJ SOLN
INTRAMUSCULAR | Status: AC
  Filled 2021-05-30: qty 1

## 2021-05-30 MED ORDER — KETOROLAC TROMETHAMINE 30 MG/ML IJ SOLN
30.0000 mg | Freq: Four times a day (QID) | INTRAMUSCULAR | Status: AC
Start: 2021-05-30 — End: 2021-05-31
  Administered 2021-05-30 – 2021-05-31 (×4): 30 mg via INTRAVENOUS
  Filled 2021-05-30 (×4): qty 1

## 2021-05-30 MED ORDER — SCOPOLAMINE 1 MG/3DAYS TD PT72
MEDICATED_PATCH | TRANSDERMAL | Status: DC | PRN
  Administered 2021-05-30: 1 via TRANSDERMAL

## 2021-05-30 MED ORDER — NALOXONE HCL 0.4 MG/ML IJ SOLN: 0.4000 mg | INTRAMUSCULAR | Status: DC | PRN

## 2021-05-30 MED ORDER — MEASLES, MUMPS & RUBELLA VAC IJ SOLR: 0.5000 mL | Freq: Once | INTRAMUSCULAR | Status: DC

## 2021-05-30 MED ORDER — NALBUPHINE HCL 10 MG/ML IJ SOLN
5.0000 mg | INTRAMUSCULAR | Status: DC | PRN
Start: 2021-05-30 — End: 2021-06-01

## 2021-05-30 MED ORDER — WITCH HAZEL-GLYCERIN EX PADS: 1.0000 "application " | MEDICATED_PAD | CUTANEOUS | Status: DC | PRN

## 2021-05-30 MED ORDER — DEXMEDETOMIDINE (PRECEDEX) IN NS 20 MCG/5ML (4 MCG/ML) IV SYRINGE
PREFILLED_SYRINGE | INTRAVENOUS | Status: AC
  Filled 2021-05-30: qty 5

## 2021-05-30 MED ORDER — MENTHOL 3 MG MT LOZG: 1.0000 | LOZENGE | OROMUCOSAL | Status: DC | PRN

## 2021-05-30 MED ORDER — DIPHENHYDRAMINE HCL 25 MG PO CAPS
25.0000 mg | ORAL_CAPSULE | Freq: Four times a day (QID) | ORAL | Status: DC | PRN
  Administered 2021-05-30 – 2021-05-31 (×3): 25 mg via ORAL
  Filled 2021-05-30 (×3): qty 1

## 2021-05-30 MED ORDER — SODIUM CHLORIDE 0.9 % IV SOLN
INTRAVENOUS | Status: AC
  Filled 2021-05-30: qty 500

## 2021-05-30 MED ORDER — SIMETHICONE 80 MG PO CHEW
80.0000 mg | CHEWABLE_TABLET | Freq: Three times a day (TID) | ORAL | Status: DC
  Administered 2021-05-30 – 2021-06-01 (×6): 80 mg via ORAL
  Filled 2021-05-30 (×6): qty 1

## 2021-05-30 MED ORDER — LACTATED RINGERS IV SOLN: INTRAVENOUS | Status: DC | PRN

## 2021-05-30 MED ORDER — TETANUS-DIPHTH-ACELL PERTUSSIS 5-2.5-18.5 LF-MCG/0.5 IM SUSY: 0.5000 mL | PREFILLED_SYRINGE | Freq: Once | INTRAMUSCULAR | Status: DC

## 2021-05-30 MED ORDER — CARBOPROST TROMETHAMINE 250 MCG/ML IM SOLN
INTRAMUSCULAR | Status: AC
  Filled 2021-05-30: qty 1

## 2021-05-30 MED ORDER — MEPERIDINE HCL 25 MG/ML IJ SOLN: 6.2500 mg | INTRAMUSCULAR | Status: DC | PRN

## 2021-05-30 MED ORDER — MEDROXYPROGESTERONE ACETATE 150 MG/ML IM SUSP: 150.0000 mg | INTRAMUSCULAR | Status: DC | PRN

## 2021-05-30 MED ORDER — MORPHINE SULFATE (PF) 0.5 MG/ML IJ SOLN
INTRAMUSCULAR | Status: AC
  Filled 2021-05-30: qty 10

## 2021-05-30 MED ORDER — CEFAZOLIN SODIUM-DEXTROSE 2-4 GM/100ML-% IV SOLN
2.0000 g | INTRAVENOUS | Status: AC
  Administered 2021-05-30: 2 g via INTRAVENOUS

## 2021-05-30 MED ORDER — IBUPROFEN 600 MG PO TABS
600.0000 mg | ORAL_TABLET | Freq: Four times a day (QID) | ORAL | Status: DC
  Administered 2021-05-31 – 2021-06-01 (×7): 600 mg via ORAL
  Filled 2021-05-30 (×7): qty 1

## 2021-05-30 MED ORDER — SODIUM CHLORIDE 0.9% FLUSH
3.0000 mL | INTRAVENOUS | Status: DC | PRN
  Administered 2021-05-31: 3 mL via INTRAVENOUS

## 2021-05-30 MED ORDER — SOD CITRATE-CITRIC ACID 500-334 MG/5ML PO SOLN: 30.0000 mL | ORAL | Status: DC

## 2021-05-30 MED ORDER — DEXAMETHASONE SODIUM PHOSPHATE 4 MG/ML IJ SOLN
INTRAMUSCULAR | Status: DC | PRN
  Administered 2021-05-30: 4 mg via INTRAVENOUS

## 2021-05-30 MED ORDER — SCOPOLAMINE 1 MG/3DAYS TD PT72: 1.0000 | MEDICATED_PATCH | Freq: Once | TRANSDERMAL | Status: DC

## 2021-05-30 MED ORDER — LACTATED RINGERS IV SOLN: INTRAVENOUS | Status: DC

## 2021-05-30 MED ORDER — FENTANYL CITRATE (PF) 100 MCG/2ML IJ SOLN
INTRAMUSCULAR | Status: DC | PRN
  Administered 2021-05-30: 100 ug via EPIDURAL

## 2021-05-30 MED ORDER — ONDANSETRON HCL 4 MG/2ML IJ SOLN: 4.0000 mg | Freq: Three times a day (TID) | INTRAMUSCULAR | Status: DC | PRN

## 2021-05-30 MED ORDER — OXYTOCIN-SODIUM CHLORIDE 30-0.9 UT/500ML-% IV SOLN: 4.0000 [IU]/h | INTRAVENOUS | Status: AC

## 2021-05-30 SURGICAL SUPPLY — 32 items
ADH SKN CLS APL DERMABOND .7 (GAUZE/BANDAGES/DRESSINGS) ×1
APL PRP STRL LF DISP 70% ISPRP (MISCELLANEOUS) ×2
APL SKNCLS STERI-STRIP NONHPOA (GAUZE/BANDAGES/DRESSINGS) ×1
BENZOIN TINCTURE PRP APPL 2/3 (GAUZE/BANDAGES/DRESSINGS) ×1 IMPLANT
CHLORAPREP W/TINT 26 (MISCELLANEOUS) ×2 IMPLANT
CLAMP CORD UMBIL (MISCELLANEOUS) ×1 IMPLANT
CLOSURE STERI-STRIP 1/4X4 (GAUZE/BANDAGES/DRESSINGS) ×1 IMPLANT
CLOTH BEACON ORANGE TIMEOUT ST (SAFETY) ×1 IMPLANT
DERMABOND ADVANCED (GAUZE/BANDAGES/DRESSINGS) ×1
DERMABOND ADVANCED .7 DNX12 (GAUZE/BANDAGES/DRESSINGS) IMPLANT
DRSG OPSITE POSTOP 4X10 (GAUZE/BANDAGES/DRESSINGS) ×1 IMPLANT
ELECT REM PT RETURN 9FT ADLT (ELECTROSURGICAL) ×2
ELECTRODE REM PT RTRN 9FT ADLT (ELECTROSURGICAL) IMPLANT
EXTRACTOR VACUUM KIWI (MISCELLANEOUS) ×1 IMPLANT
GLOVE SURG ENC MOIS LTX SZ6 (GLOVE) ×1 IMPLANT
GOWN STRL REUS W/TWL LRG LVL3 (GOWN DISPOSABLE) ×3 IMPLANT
KIT ABG SYR 3ML LUER SLIP (SYRINGE) ×1 IMPLANT
NDL HYPO 25X5/8 SAFETYGLIDE (NEEDLE) IMPLANT
NEEDLE HYPO 25X5/8 SAFETYGLIDE (NEEDLE) ×2 IMPLANT
NS IRRIG 1000ML POUR BTL (IV SOLUTION) ×1 IMPLANT
PACK C SECTION WH (CUSTOM PROCEDURE TRAY) ×1 IMPLANT
PAD ABD 7.5X8 STRL (GAUZE/BANDAGES/DRESSINGS) ×1 IMPLANT
PAD OB MATERNITY 4.3X12.25 (PERSONAL CARE ITEMS) ×1 IMPLANT
PENCIL SMOKE EVAC W/HOLSTER (ELECTROSURGICAL) ×1 IMPLANT
RTRCTR C-SECT PINK 25CM LRG (MISCELLANEOUS) ×1 IMPLANT
SUT PROLENE 1 CT (SUTURE) ×1 IMPLANT
SUT VIC AB 0 CT1 27 (SUTURE) ×2
SUT VIC AB 0 CT1 27XBRD ANBCTR (SUTURE) IMPLANT
SUT VIC AB 0 CT1 36 (SUTURE) ×1 IMPLANT
TOWEL OR 17X24 6PK STRL BLUE (TOWEL DISPOSABLE) ×2 IMPLANT
TRAY FOLEY W/BAG SLVR 14FR LF (SET/KITS/TRAYS/PACK) ×1 IMPLANT
WATER STERILE IRR 1000ML POUR (IV SOLUTION) ×1 IMPLANT

## 2021-05-30 NOTE — Discharge Summary (Addendum)
Postpartum Discharge Summary     Patient Name: Kayla Bradley DOB: 14-Sep-1991 MRN: [id number]  Date of admission: 05/28/2021 Delivery date:05/30/2021  Delivering provider: Radene Gunning  Date of discharge: 06/01/2021  Admitting diagnosis: Gestational diabetes mellitus, class A2 [O24.419] Intrauterine pregnancy: [redacted]w[redacted]d    Secondary diagnosis:  Principal Problem:   Cesarean delivery delivered Active Problems:   H/O: C-section   H/O postpartum hemorrhage, currently pregnant   Obesity   Gestational diabetes mellitus, class A2   COVID-19 affecting puerperium  Additional problems: none    Discharge diagnosis: Term Pregnancy Delivered                                              Post partum procedures: n/a Augmentation: AROM, Pitocin, and IP Foley Complications: [id number] Hospital course: Induction of Labor With Cesarean Section   30y.o. yo G2P2002 at 324w3das admitted to the hospital 05/28/2021 for induction of labor due to A2GDM. Incidental finding of + COVID-19 on admission. Asymptomatic. Her induction was started with Pitocin followed by a foley balloon and AROM. Patient remained around 4-5 cm of dilation for prolonged period despite augmentation with Pitocin up to 30 milli-units/min for 5 hours. Patient was offered Cesarean section at this point due to hx of CS, fetal macrosomia, and prolonged latent phase. The patient elected to proceed with Cesarean delivery. The patient went for cesarean section due to Prior Uterine Surgery, Macrosomia, and Arrest of Dilation. Delivery details are as follows: Membrane Rupture Time/Date: 1:15 AM ,05/30/2021   Delivery Method:C-Section, Low Transverse  Details of operation can be found in separate operative Note.  Patient had a postpartum course complicated by anemia. Hgb 8.0. Venofer ordered, but IV infiltrated. IV restarted and Venofer given. Mild swelling and slight discoloration noted at site of infiltration. No pain, redness,  drainage or tenderness. She is ambulating, tolerating a regular diet, passing flatus, and urinating well.  Patient is discharged home in stable condition on 06/01/21.   Mildly elevated BP noted. Reported HA on POD#2 that resolved w/ pain meds. Pre-E labs Nml. Only one elevated BP postpartum. BP meds not indicated. BP check in 1 week. Pre-E precautions.   Newborn Data: Birth date:05/30/2021  Birth time:1:16 AM  Gender:Female  Living status:Living  Apgars:8 ,9  Weight:3779 g                                Magnesium Sulfate received: No BMZ received: No Rhophylac:N/A MMR:N/A - Immune T-DaP:Given prenatally Flu: No Transfusion:No  Physical exam  Vitals:   05/31/21 1845 05/31/21 2309 06/01/21 0544 06/01/21 1129  BP: (!) 147/67 126/62 127/71 127/78  Pulse: 70 80 87   Resp: _0 Temp: 98.8 F (37.1 C) 98.2 F (36.8 C) 98 F (36.7 C)   TempSrc: Oral Axillary Oral   SpO2:  100%    Weight:      Height:       General: alert, cooperative, and no distress Lochia: appropriate Uterine Fundus: firm Incision: Healing well with no significant drainage, No significant erythema, Dressing is clean, dry, and intact DVT Evaluation: No evidence of DVT seen on physical exam. Negative Homan's sign. No cords or calf tenderness. No significant calf/ankle edema. Labs: Lab Results  Component Value Date   WBC 14.0 (H) 05/31/2021  HGB 8.0 (L) 05/31/2021   HCT 25.2 (L) 05/31/2021   MCV 88.1 05/31/2021   PLT 205 05/31/2021   CMP Latest Ref Rng & Units 06/01/2021  Glucose 70 - 99 mg/dL 86  BUN 6 - 20 mg/dL <5(L)  Creatinine 0.44 - 1.00 mg/dL 0.55  Sodium 135 - 145 mmol/L 139  Potassium 3.5 - 5.1 mmol/L 3.4(L)  Chloride 98 - 111 mmol/L 106  CO2 22 - 32 mmol/L 26  Calcium 8.9 - 10.3 mg/dL 8.3(L)  Total Protein 6.5 - 8.1 g/dL 4.5(L)  Total Bilirubin 0.3 - 1.2 mg/dL 0.4  Alkaline Phos 38 - 126 U/L 75  AST 15 - 41 U/L 33  ALT 0 - 44 U/L 18   Edinburgh Score: Edinburgh Postnatal  Depression Scale Screening Tool 05/30/2021  I have been able to laugh and see the funny side of things. 0  I have looked forward with enjoyment to things. 0  I have blamed myself unnecessarily when things went wrong. 0  I have been anxious or worried for no good reason. 0  I have felt scared or panicky for no good reason. 0  Things have been getting on top of me. 0  I have been so unhappy that I have had difficulty sleeping. 0  I have felt sad or miserable. 0  I have been so unhappy that I have been crying. 0  The thought of harming myself has occurred to me. 0  Edinburgh Postnatal Depression Scale Total 0     After visit meds:  Allergies as of 06/01/2021       Reactions   Escitalopram Other (See Comments)   Made patient feel crazy        Medication List     STOP taking these medications    aspirin EC 81 MG tablet   metFORMIN 500 MG tablet Commonly known as: Glucophage   Vitafol Gummies 3.33-0.333-34.8 MG Chew       TAKE these medications    Accu-Chek Guide Me w/Device Kit See admin instructions.   Accu-Chek Guide test strip Generic drug: glucose blood Use to check blood sugars four times a day was instructed   Accu-Chek Softclix Lancets lancets Check blood sugars four times a day.   Blood Pressure Kit Devi 1 kit by Does not apply route once a week.   ibuprofen 600 MG tablet Commonly known as: ADVIL Take 1 tablet (600 mg total) by mouth every 6 (six) hours.   norethindrone 0.35 MG tablet Commonly known as: Ortho Micronor Take 1 tablet (0.35 mg total) by mouth daily.   oxyCODONE 5 MG immediate release tablet Commonly known as: Oxy IR/ROXICODONE Take 1-2 tablets (5-10 mg total) by mouth every 4 (four) hours as needed for moderate pain.   Vitafol Ultra 29-0.6-0.4-200 MG Caps Take 1 capsule by mouth daily.         Discharge home in stable condition Infant Feeding: Breast Infant Disposition:home with mother Discharge instruction: per After Visit  Summary and Postpartum booklet. Activity: Advance as tolerated. Pelvic rest for 6 weeks.  Diet: routine diet Future Appointments:No future appointments. Follow up Visit:  [facility]. Call.   Why: To scheduled blood pressure check in 3-7 days. Contact information: [address] [phone]        [facility] Follow up.   Specialty: Obstetrics and Gynecology Why: As needed in emergencies Contact information: [address]  [id number] [place] [address] [phone]               Message sent by Dr. Albert on 8/28 to [facility].  Please schedule this patient for a In person postpartum visit in 4 weeks with the following provider: MD. Additional Postpartum F/U: 2 hour GTT and Incision check 1 week  High risk pregnancy complicated by: GDM, Hx of CS, fetal macrosomia Delivery mode:  C-Section, Low Transverse  Anticipated Birth Control:  POPs  #Anemia: hgb decreased from 11.6>9.1>8, patient with some fatigue and dizziness, will give venofer.   #GDMA1: fasting BG 120, however patient had pizza, so not truly fasting. F/u pp in 1 week. D/C Insulin.   06/01/2021 Virginia Smith, CNM    

## 2021-05-30 NOTE — Anesthesia Postprocedure Evaluation (Signed)
Anesthesia Post Note  Patient: Kayla Bradley  Procedure(s) Performed: CESAREAN SECTION (Bilateral)     Patient location during evaluation: PACU Anesthesia Type: Epidural Level of consciousness: awake and alert Pain management: pain level controlled Vital Signs Assessment: post-procedure vital signs reviewed and stable Respiratory status: spontaneous breathing, respiratory function stable and nonlabored ventilation Cardiovascular status: blood pressure returned to baseline Postop Assessment: epidural receding and no apparent nausea or vomiting Anesthetic complications: no   No notable events documented.  Last Vitals:  Vitals:   05/30/21 0315 05/30/21 0349  BP: 133/69 118/85  Pulse: 95 82  Resp: (!) 24 17  Temp:  37 C  SpO2: 100% 100%    Last Pain:  Vitals:   05/30/21 0345  TempSrc:   PainSc: 2    Pain Goal:    LLE Motor Response: Purposeful movement (05/30/21 0345) LLE Sensation: Tingling (05/30/21 0345) RLE Motor Response: Purposeful movement (05/30/21 0345) RLE Sensation: Tingling (05/30/21 0345)     Epidural/Spinal Function Cutaneous sensation: Tingles (05/30/21 0345), Patient able to flex knees: Yes (05/30/21 0345), Patient able to lift hips off bed: No (05/30/21 0345), Back pain beyond tenderness at insertion site: No (05/30/21 0345), Progressively worsening motor and/or sensory loss: No (05/30/21 0345), Bowel and/or bladder incontinence post epidural: No (05/30/21 0345)  Beryle Lathe

## 2021-05-30 NOTE — Progress Notes (Signed)
Labor Progress Note Kayla Bradley is a 30 y.o. G2P1001 at [redacted]w[redacted]d presented for IOL due to A2GDM.   S: Patient is doing well overall. Husband at bedside.  O:  BP 139/63 (BP Location: Right Arm)   Pulse 91   Temp 98.1 F (36.7 C) (Oral)   Resp 18   Ht 5\' 2"  (1.575 m)   Wt 89.8 kg   LMP 08/27/2020 (Approximate)   SpO2 100%   BMI 36.21 kg/m   EFM: Baseline 140 bpm/moderate variability/+ accels/- decels  CVE: Dilation: 6 Effacement (%): 90 Cervical Position: Middle Station: -1 Presentation: Vertex Exam by:: Warren-Hill, RN  A&P: 30 y.o. G2P1001 [redacted]w[redacted]d here for IOL due to A2GDM.  #Labor: Minimal change since noon on 8/27 despite augmentation with Pitocin at 30 milli-units/min for 5 hours and AROM. Patient started Pitocin break at ~6 PM on 8/27 with plan to restart after 4 hours with IUPC. Discussed with patient at bedside progress thus far and risks with TOLAC versus repeat CS at this point. EFW > 99%ile, estimated between 4400-4500g at this time. Discussed increased risk of shoulder dystocia with vaginal delivery. Operative delivery recommended against per MFM. Given prolonged latent phase, fetal macrosomia, and hx of CS, offered repeat Cesarean section. After discussion with her husband, patient ultimately decided to proceed with Cesarean section.  The risks of surgery were discussed with the patient including but were not limited to: bleeding which may require transfusion or reoperation; infection which may require antibiotics; injury to bowel, bladder, ureters or other surrounding organs; injury to the fetus; need for additional procedures including hysterectomy in the event of a life-threatening hemorrhage; formation of adhesions; placental abnormalities with subsequent pregnancies; incisional problems; thromboembolic phenomenon and other postoperative/anesthesia complications.  The patient concurred with the proposed plan, giving informed written consent for the procedure.   Anesthesia and OR aware. Preoperative prophylactic antibiotics and SCDs ordered on call to the OR.  To OR when ready.  #Pain: Epidural  #FWB: Cat 1  #GBS negative  A2GDM: Glucose within normal range. Will plan to obtain fasting CBG on POD#1.    Hx of PPH: Plan for TXA at delivery.   9/27, MD OB Fellow  [facility] 12:43 AM

## 2021-05-30 NOTE — Lactation Note (Signed)
This note was copied from a baby's chart. Lactation Consultation Note  Patient Name: Girl Faline Langer FAOZH'Y Date: 05/30/2021 Reason for consult: Initial assessment;Early term 37-38.6wks;Other (Comment);Maternal endocrine disorder (P2, per mom baby has latched a few times for short time, and I wasn't feeling well last night and asked for formula . LC encouraged mom to call when she desires to latch) Age:29 hours  Maternal Data-  Does the patient have breastfeeding experience prior to this delivery?: Yes How long did the patient breastfeed?: per mom with 1st baby had a PPH,effected milk supply  Feeding Mother's Current Feeding Choice: Breast Milk and Formula  LATCH Score                    Lactation Tools Discussed/Used    Interventions Interventions: Breast feeding basics reviewed;Education  Discharge    Consult Status Consult Status: Follow-up Date: 05/30/21 Follow-up type: In-patient    Matilde Sprang Sharron Simpson 05/30/2021, 9:29 AM

## 2021-05-30 NOTE — Transfer of Care (Signed)
Immediate Anesthesia Transfer of Care Note  Patient: Kayla Bradley  Procedure(s) Performed: CESAREAN SECTION (Bilateral)  Patient Location: PACU  Anesthesia Type:Epidural  Level of Consciousness: awake, alert  and patient cooperative  Airway & Oxygen Therapy: Patient Spontanous Breathing  Post-op Assessment: Report given to RN and Post -op Vital signs reviewed and stable  Post vital signs: Reviewed and stable  Last Vitals:  Vitals Value Taken Time  BP 134/52 05/30/21 0215  Temp 36.7 C 05/30/21 0215  Pulse 89 05/30/21 0220  Resp 25 05/30/21 0220  SpO2 100 % 05/30/21 0220  Vitals shown include unvalidated device data.  Last Pain:  Vitals:   05/29/21 2331  TempSrc: Oral  PainSc:          Complications: No notable events documented.

## 2021-05-30 NOTE — Op Note (Signed)
Concha Se  PROCEDURE DATE: 05/30/2021  PREOPERATIVE DIAGNOSES: Intrauterine pregnancy at [redacted]w[redacted]d weeks gestation; failure to progress: arrest of dilation and fetal macrosomia  POSTOPERATIVE DIAGNOSES: The same  PROCEDURE: Repeat Low Transverse Cesarean Section  SURGEON:  Dr. Milas Hock  ASSISTANT:  Dr. Evalina Field  ANESTHESIOLOGY TEAM: Anesthesiologist: Beryle Lathe, MD; Lowella Curb, MD CRNA: Trellis Paganini, CRNA  INDICATIONS: Kayla Bradley is a 30 y.o. [phone] at [redacted]w[redacted]d here for cesarean section secondary to the indications listed under preoperative diagnoses; please see preoperative note for further details.  The risks of surgery were discussed with the patient including but were not limited to: bleeding which may require transfusion or reoperation; infection which may require antibiotics; injury to bowel, bladder, ureters or other surrounding organs; injury to the fetus; need for additional procedures including hysterectomy in the event of a life-threatening hemorrhage; formation of adhesions; placental abnormalities wth subsequent pregnancies; incisional problems; thromboembolic phenomenon and other postoperative/anesthesia complications.  The patient concurred with the proposed plan, giving informed written consent for the procedure.    FINDINGS:  Viable female infant in cephalic presentation.  Apgars 8 and 9.  Clear amniotic fluid.  Intact placenta, three vessel cord.  Normal uterus, fallopian tubes and ovaries bilaterally.  ANESTHESIA: Epidural  INTRAVENOUS FLUIDS: 2650 ml   ESTIMATED BLOOD LOSS: 1270 ml URINE OUTPUT:  300 ml SPECIMENS: Placenta sent to L&D COMPLICATIONS: None immediate  PROCEDURE IN DETAIL:  The patient preoperatively received intravenous antibiotics and had sequential compression devices applied to her lower extremities.  She was then taken to the operating room where epidural was dosed up to surgical level and was found to be  adequate. She was then placed in a dorsal supine position with a leftward tilt, and prepped and draped in a sterile manner.  A foley catheter was was already in place.    After an adequate timeout was performed, a Pfannenstiel skin incision was made with scalpel on her preexisting scar and carried through to the underlying layer of fascia. The fascia was incised in the midline, and this incision was extended bilaterally using the Mayo scissors.  Kocher clamps were applied to the superior aspect of the fascial incision and the underlying rectus muscles were dissected off bluntly and sharply.  The rectus muscles were separated in the midline and the peritoneum was entered bluntly. A bladder blade was placed.  Attention was turned to the lower uterine segment where a low transverse hysterotomy was made with a scalpel and extended bilaterally bluntly.  The infant was successfully delivered, the cord was clamped and cut after one minute, and the infant was handed over to the awaiting neonatology team. Uterine massage was then administered, and the placenta delivered intact with a three-vessel cord. The uterus was then cleared of clots and debris.    The hysterotomy was closed with 0 Vicryl in a running locked fashion, and an imbricating layer was also placed with 0 Vicryl. Figure-of-eight 0 Vicryl serosal stitches were placed to help with hemostasis. The pelvis was cleared of all clot and debris. The peritoneum was closed with a 2-0 Vicryl running stitch. The fascia was then closed using 0 Vicryl in a running fashion.  The subcutaneous layer was irrigated and everything was hemostatic. Due to depth of tissue, it was re-approximated with 2-0 plain gut in a running fashion. The skin was closed with a 4-0 Vicryl subcuticular stitch. The patient tolerated the procedure well. Sponge, instrument and needle counts were correct x 3.  She was taken to the recovery room in stable condition.  Evalina Field, MD  OB Fellow   [facility]

## 2021-05-31 LAB — CBC
HCT: 25.2 % — ABNORMAL LOW (ref 36.0–46.0)
Hemoglobin: 8 g/dL — ABNORMAL LOW (ref 12.0–15.0)
MCH: 28 pg (ref 26.0–34.0)
MCHC: 31.7 g/dL (ref 30.0–36.0)
MCV: 88.1 fL (ref 80.0–100.0)
Platelets: 205 10*3/uL (ref 150–400)
RBC: 2.86 MIL/uL — ABNORMAL LOW (ref 3.87–5.11)
RDW: 15.9 % — ABNORMAL HIGH (ref 11.5–15.5)
WBC: 14 10*3/uL — ABNORMAL HIGH (ref 4.0–10.5)
nRBC: 0 % (ref 0.0–0.2)

## 2021-05-31 LAB — GLUCOSE, CAPILLARY: Glucose-Capillary: 120 mg/dL — ABNORMAL HIGH (ref 70–99)

## 2021-05-31 MED ORDER — OXYCODONE HCL 5 MG PO TABS: 5.0000 mg | ORAL_TABLET | ORAL | 0 refills | Status: DC | PRN

## 2021-05-31 MED ORDER — IBUPROFEN 600 MG PO TABS: 600.0000 mg | ORAL_TABLET | Freq: Four times a day (QID) | ORAL | 0 refills | Status: DC

## 2021-05-31 MED ORDER — SODIUM CHLORIDE 0.9 % IV SOLN
500.0000 mg | Freq: Once | INTRAVENOUS | Status: AC
  Administered 2021-05-31: 500 mg via INTRAVENOUS
  Filled 2021-05-31: qty 25

## 2021-05-31 MED ORDER — NORETHINDRONE 0.35 MG PO TABS: 1.0000 | ORAL_TABLET | Freq: Every day | ORAL | 11 refills | Status: DC

## 2021-05-31 NOTE — Progress Notes (Signed)
Subjective: Postpartum Day 1: Cesarean Delivery Patient reports tolerating PO, + flatus, and no problems voiding.  She would like to go home today as her c/d was 2 AM on 8/28, however, needs venofer and IV infiltrated, will stay.  Objective: Vital signs in last 24 hours: Temp:  [98.1 F (36.7 C)-98.6 F (37 C)] 98.1 F (36.7 C) (08/29 0510) Pulse Rate:  [69-78] 69 (08/29 0510) Resp:  [18] 18 (08/29 0510) BP: (108-135)/(57-64) 117/57 (08/29 0510) SpO2:  [99 %-100 %] 100 % (08/29 0510)  Physical Exam:  General: alert, cooperative, appears stated age, and no distress Lochia: appropriate Uterine Fundus: firm Incision: healing well, no significant drainage, no dehiscence, honeycomb dressing c/d/i DVT Evaluation: No evidence of DVT seen on physical exam. Negative Homan's sign. No cords or calf tenderness. No significant calf/ankle edema.  Recent Labs    05/30/21 0540 05/31/21 0204  HGB 9.1* 8.0*  HCT 29.0* 25.2*    Assessment/Plan: Status post Cesarean section. Doing well postoperatively.  Continue current care.  #Anemia: will receive venofer after PIV replaced.  #BC: wants POPs for birth control.  Shirlean Mylar 05/31/2021, 2:28 PM

## 2021-06-01 DIAGNOSIS — U071 COVID-19: Secondary | ICD-10-CM | POA: Diagnosis present

## 2021-06-01 LAB — COMPREHENSIVE METABOLIC PANEL
ALT: 18 U/L (ref 0–44)
AST: 33 U/L (ref 15–41)
Albumin: 2 g/dL — ABNORMAL LOW (ref 3.5–5.0)
Alkaline Phosphatase: 75 U/L (ref 38–126)
Anion gap: 7 (ref 5–15)
BUN: <5 mg/dL — ABNORMAL LOW (ref 6–20)
CO2: 26 mmol/L (ref 22–32)
Calcium: 8.3 mg/dL — ABNORMAL LOW (ref 8.9–10.3)
Chloride: 106 mmol/L (ref 98–111)
Creatinine, Ser: 0.55 mg/dL (ref 0.44–1.00)
GFR, Estimated: >60 mL/min (ref >60)
Glucose, Bld: 86 mg/dL (ref 70–99)
Potassium: 3.4 mmol/L — ABNORMAL LOW (ref 3.5–5.1)
Sodium: 139 mmol/L (ref 135–145)
Total Bilirubin: 0.4 mg/dL (ref 0.3–1.2)
Total Protein: 4.5 g/dL — ABNORMAL LOW (ref 6.5–8.1)

## 2021-06-01 MED ORDER — LACTATED RINGERS IV SOLN: INTRAVENOUS | Status: DC

## 2021-06-01 MED ORDER — SOD CITRATE-CITRIC ACID 500-334 MG/5ML PO SOLN: 30.0000 mL | Freq: Once | ORAL | Status: DC

## 2021-06-01 MED ORDER — ORAL CARE MOUTH RINSE: 15.0000 mL | Freq: Once | OROMUCOSAL | Status: DC

## 2021-06-01 MED ORDER — SCOPOLAMINE 1 MG/3DAYS TD PT72: 1.0000 | MEDICATED_PATCH | Freq: Once | TRANSDERMAL | Status: DC

## 2021-06-01 MED ORDER — CHLORHEXIDINE GLUCONATE 0.12 % MT SOLN
15.0000 mL | Freq: Once | OROMUCOSAL | Status: DC
  Filled 2021-06-01: qty 15

## 2021-06-01 MED ORDER — FAMOTIDINE 20 MG PO TABS: 20.0000 mg | ORAL_TABLET | Freq: Once | ORAL | Status: DC

## 2021-06-01 NOTE — Discharge Instructions (Signed)
[facility] Guideline: Hypertension in Pregnancy  Hypertension complicates approximately 10% of pregnancies. It is associated with an increased risk for fetal morbidity (12% of preterm deliveries), maternal morbidity (stroke, renal failure, heart failure, eclampsia) and is one of the most common etiologies for maternal mortality (12-13%). The [employer] of Obstetricians and Gynecologist published recommendations from their Task Force on Hypertension in Pregnancy in November 2013. The following guideline is based primarily on recommendations from the ACOG Task Force.  Classification:  Chronic hypertension Gestational hypertension Preeclampsia without severe features with severe features Chronic hypertension with superimposed preeclampsia without severe features with severe features  The terms pregnancy induced hypertension (PIH) and mild preeclampsia should be avoided.  Definitions:  Hypertension (either of the following) taken > 4 hours apart: systolic blood pressure > 140 or diastolic blood pressure > 90  technique: sitting position with cuff at level of right atrium Avoid improper technique: left lateral with cuff on upper arm (gives falsely low readings)  Severe hypertension (either of following): systolic blood pressure > 160 or diastolic blood pressure > 110 the diagnosis of severe hypertension can be made sooner than 4 hours to expedite anti-hypertensive therapy  Proteinuria: 24 hour urine: > 300 mg protein/creatinine ratio: > 0.3  Chronic Hypertension: hypertension present before pregnancy or before [redacted] weeks  Gestational hypertension: hypertension that develops after 20 weeks no proteinuria Preeclampsia: Hypertension that develops after 20 weeks plus one of the following proteinuria severe features: severe hypertension (SBP > 160 or DBP > 110) thrombocytopenia (platelets <100,000) impaired liver function (LFTs 2x normal or persistent RUQ or  epigastric pain) new onset/worsening renal insufficiency (creatinine > 1.1 mg/dL or doubling of creatinine) pulmonary edema new onset cerebral or visual disturbances (amount of proteinuria is no longer a criterion for severe preeclampsia) Chronic Hypertension   Definition (either of the following): present before pregnancy present before [redacted] weeks gestation   Antihypertensive therapy indicated: persistent SBP > 160 or persistent DBP > 105 lower values may be used for starting therapy, in presence of end-organ damage (i.e. renal, cardiac disease) BP goal: SBP 120-159 DBP 80-105   Anti-hypertensive medications (first-line)  Agent Dose Comments  Labetalol [phone] mg/d (2-3 divided doses) caution in patients with asthma or CHF  Nifedipine 30-120 mg XL avoid sublingual capsules  Methyldopa [phone] mg (2-3 divided doses) least effective for severe hypertension   Antenatal Surveillance  Ultrasound: screen for growth restriction (timing not addressed by ACOG) 28-32 weeks at minimum more frequent, if fundal height lagging, difficult to assess fundal height, or coexisting problems  Antenatal testing, if taking antihypertensive medication or coexisting medical problems (diabetes, preeclampsia, etc) 1-2x per week, if hypertension only 2x per week, if fetal growth restriction present umbilical artery Doppler weekly, if growth restriction present  Delivery timing  >[redacted]w[redacted]d, if no other indications except for chronic hypertension   Seizure prophylaxis  Magnesium sulfate not indicated, unless there is superimposed preeclampsia with severe features Gestational Hypertension   Definition (all of the following) hypertension that develops after 20 weeks no proteinuria no severe features   Antihypertensive therapy: therapy is not indicated for mild gestational hypertension (BP 140-159/90-105) if blood pressure is > 160 or 110, then patient has severe features and therapy is  indicated (see section VI)    Management daily assessment for symptoms (headache, visual changes, epigastric/RUQ pain) blood pressure assessment 2x per week 2x per week in office (or once in office and once at home) weekly assessment of protein labs weekly: LFTs, platelets, creatinine  Antenatal  Surveillance Fetal movement counts: daily by patient  Ultrasound: screen for growth every 3 weeks after diagnosis  Antenatal testing: once weekly (NST + AFI) or BPP  Delivery timing  >[redacted]w[redacted]d, or sooner if there are other indications (nonreassuring testing, severe features, etc)   Seizure prophylaxis  Magnesium sulfate is not universally indicated Begin magnesium sulfate, if severe disease develops during labor or postpartum Preeclampsia without severe features   Definition: Hypertension that develops after 20 weeks and each of the following proteinuria (> 300 mg/24 hours or P/C > 0.3) no severe features: severe hypertension (SBP > 160 or DBP > 110) thrombocytopenia (platelets <100,000) impaired liver function (LFTs 2x normal or persistent RUQ or epigastric pain) new onset/worsening renal insufficiency (creatinine > 1.1 mg/dL or doubling of creatinine) pulmonary edema new onset cerebral or visual disturbances (amount of proteinuria is not a criterion for severe preeclampsia)  Antihypertensive therapy: therapy is not indicated for mild hypertension (BP 140-159/90-105) if blood pressure is > 160 or 110, then patient has severe features and therapy is indicated (see section VI)    Management of preeclampsia without severe features daily assessment for symptoms (headache, visual changes, epigastric/RUQ pain) blood pressure assessment 2x per week labs weekly: LFTs, platelets, creatinine  Antenatal Surveillance for preeclampsia without severe features Fetal movement counts: daily by patient  Ultrasound: screen for growth restriction every 3 weeks after diagnosis  Antenatal  testing: twice weekly (NST + AFI) or BPP   Delivery timing for preeclampsia without severe features  >110w0d, or sooner if there are other indications (nonreassuring testing, severe features, etc)     Seizure prophylaxis  Magnesium sulfate is not universally indicated for preeclampsia without severe features should be started, if there is progression to severe disease during labor or postpartum can be used for preeclampsia without severe features on an individual basis Preeclampsia with severe features  Definition: Hypertension that develops after 20 weeks and: severe features: severe hypertension (SBP > 160 or DBP > 110) thrombocytopenia (platelets <100,000) impaired liver function (LFTs 2x normal or persistent RUQ or epigastric pain) new onset/worsening renal insufficiency (creatinine > 1.1 mg/dL or doubling of creatinine) pulmonary edema new onset cerebral or visual disturbances (amount of proteinuria is not a criterion for severe preeclampsia) proteinuria is not required for the diagnosis of preeclampsia with severe features   Antihypertensive therapy: therapy is indicated for severe hypertension: SBP > 160 or DBP > 110   Antihypertensive agents for acute, severe hypertension:  Agent Dose Comments  Labetalol 10-20 mg IV initially contraindicated if asthma, CHF, or heart disease then 20-80 mg every20-30 minutes maximum acute dose of 300 mg consider IV drip if persistent: 1-2 mg/min  Hydralazine 5 mg IV high or frequent doses may cause hypotension then 5-10 mg IV every 20-40 minutes  Nifedipine 10-20 mg orally reflex tachycardia and headaches common may repeat in 30 minutes, if needed then, 10-20 mg every 2-6 hours   Management of preeclampsia with severe features hospitalization; antenatal steroids if < [redacted]w[redacted]d monitor for signs/symptoms of severe disease (headaches, visual changes, RUQ/epigastric pain) daily labs: CBC, LFTs, creatinine  Antenatal Surveillance  for preeclampsia with severe features Fetal movement counts: daily by patient  Ultrasound: screen for growth every 2 weeks after diagnosis  Antenatal testing: NST daily BPP 2x per week  Seizure prophylaxis Magnesium sulfate is recommended for preeclampsia with severe features Continue magnesium sulfate during Cesarean delivery (i.e. do not stop intraoperatively)  Delivery timing for preeclampsia with severe features:  Delivery is indicated at the time of  diagnosis for any of the following: gestational age < [redacted]w[redacted]d gestational age > [redacted]w[redacted]d fetal demise eclampsia uncontrollable hypertension pulmonary edema abruption DIC nonreassuring testing  b. [redacted]w[redacted]d-[redacted]w[redacted]d: Give antenatal steroids and delay delivery 48 hours, if patient remains stable and has: PROM platelets < 100,000 impaired liver function new onset/worsening renal insufficiency EFW < 5th percentile AFI < 5 cm abnormal umbilical artery Doppler  c. [redacted]w[redacted]d-[redacted]w[redacted]d: Delivery may be delayed > 48 hours, if patient is stable with: severe hypertension, which is controllable with medication transient lab abnormalities that resolve (thrombocytopenia, elevated LFTs) no other features of severe disease        Chronic hypertension with superimposed preeclampsia  Definition: Hypertension that is present before pregnancy or before 20 weeks and is classified as:  CHTN with superimposed preeclampsia without severe features hypertension and proteinuria only proteinuria is new onset or worsening  CHTN with superimposed preeclampsia with severe features (+/- proteinuria): severe hypertension (SBP > 160 or DBP > 110) thrombocytopenia (platelets <100,000) impaired liver function (LFTs 2x normal or persistent RUQ or epigastric pain) new onset/worsening renal insufficiency (creatinine > 1.1 mg/dL or doubling of creatinine) pulmonary edema new onset cerebral or visual disturbances (amount of proteinuria is not a criterion for  severe preeclampsia) Antihypertensive therapy: therapy is indicated for severe hypertension: SBP > 160 DBP > 110   Antihypertensive agents for acute, severe hypertension: See section VI, C  Management of chronic hypertension with superimposed preeclampsia hospitalization for further evaluation monitor for signs/symptoms of severe disease (headaches, visual changes, RUQ/epigastric pain) daily labs: CBC, LFTs, creatinine  Antenatal Surveillance for superimposed preeclampsia with severe features Fetal movement counts: daily by patient  Ultrasound: screen for growth every 3 weeks after diagnosis  Antenatal testing: NST daily BPP 2x per week  Delivery timing Management same as preeclampsia with and without severe features (see section VI, G) Delivery can be delayed until [redacted]w[redacted]d, if no severe features   Seizure prophylaxis Magnesium sulfate is recommended for superimposed preeclampsia with severe features Continue magnesium sulfate during Cesarean delivery (i.e. do not stop intraoperatively)  Postpartum Management  Seizure prophylaxis continue magnesium sulfate for women with severe preeclampsia with severe features begin magnesium sulfate, if preeclampsia is diagnosed postpartum and: new onset hypertension with CNS symptoms preeclampsia with severe hypertension  Blood pressure monitoring Women with either preeclampsia or gestational hypertension should have BP monitoring: for 72 hours in hospital or equivalent outpatient assessment repeat BP assessment at 7-10 days postpartum repeat BP assessment earlier than 7-10 days, if symptoms are present  Begin anti-hypertensive therapy if: persistent SBP > 150 (persistent = 2 readings > 4 hours apart) persistent DBP > 100 Begin anti-hypertensive therapy within 1 hour if: SBP > 160 DBP > 110      Prevention  Low dose aspirin is effective in reducing risk of preeclampsia in high-risk women.  Begin low dose aspirin in late  first trimester (10-12 weeks) for women with any of following: history of early onset preeclampsia and preterm delivery < 34 weeks history of preeclampsia in > 1 prior pregnancy chronic hypertension diabetes    Monitoring for Late Cardiovascular complications  Women with a history of preeclampsia are at increased lifetime risk for cardiovascular disease. overall risk: 2-fold increase < 34 week preterm delivery: 8-9-fold increase  There are no proven strategies to decrease this risk, but the following are suggested by ACOG: Yearly assessment of: BP BMI lipids fasting glucose  Nursing care upon arrival/admission  Assess b/p using appropriate size cuff, do not lie patient on her  side. If b/p is > 160 and/or > 110: Recheck within 15 minutes If b/p remains >160 and/or >110, call provider to obtain Hypertension order set within 15 minutes Goal: administer antihypertensive medication within 60 minutes of verifying b/p >160 or >110.  Documentation (in addition to V/S, procedures, medications, etc.) Patient/family education regarding treatment, plan of care and discharge instructions. Inclusion in Care Plan.  References:  Bernsterin, P.S., et al. (2017). National partnership for maternal safety: Consensus bundle on severe  hypertension during pregnancy and the postpartum period. Obstetrics and Gynecology (130)2, pp. 347-357.  Executive summary: Hypertension in pregnancy. Obstet Gynecol 2013;122:1122-1131.  Revised: 01/10/17  

## 2021-06-02 ENCOUNTER — Telehealth: Payer: Self-pay | Admitting: *Deleted

## 2021-06-02 NOTE — Telephone Encounter (Signed)
Transition Care Management Unsuccessful Follow-up Telephone Call  Date of discharge and from where:  06/01/2021 - [facility]  Attempts:  1st Attempt  Reason for unsuccessful TCM follow-up call:  Left voice message

## 2021-06-03 NOTE — Telephone Encounter (Signed)
Transition Care Management Follow-up Telephone Call Date of discharge and from where: 06/01/2021 - [facility] How have you been since you were released from the hospital? "Doing okay" Any questions or concerns? No  Items Reviewed: Did the pt receive and understand the discharge instructions provided? Yes  Medications obtained and verified? Yes  Other? No  Any new allergies since your discharge? No  Dietary orders reviewed? No Do you have support at home? Yes    Functional Questionnaire: (I = Independent and D = Dependent) ADLs: I  Bathing/Dressing- I  Meal Prep- I  Eating- I  Maintaining continence- I  Transferring/Ambulation- I  Managing Meds- I  Follow up appointments reviewed:  PCP Hospital f/u appt confirmed? No   Specialist Hospital f/u appt confirmed? Yes  Scheduled to see OBGYN on 06/08/2021 @ 1040 and 07/12/2021 @ 0855. Are transportation arrangements needed? No  If their condition worsens, is the pt aware to call PCP or go to the Emergency Dept.? Yes Was the patient provided with contact information for the PCP's office or ED? Yes Was to pt encouraged to call back with questions or concerns? Yes

## 2021-06-08 ENCOUNTER — Ambulatory Visit: Payer: Medicaid Other

## 2021-06-10 ENCOUNTER — Telehealth (HOSPITAL_COMMUNITY): Payer: Self-pay | Admitting: *Deleted

## 2021-06-10 NOTE — Telephone Encounter (Signed)
Attempted Hospital Discharge Follow-Up Call.  Left voice mail requesting that patient return RN's phone call.  

## 2021-06-24 ENCOUNTER — Ambulatory Visit (INDEPENDENT_AMBULATORY_CARE_PROVIDER_SITE_OTHER): Payer: Medicaid Other | Admitting: Obstetrics & Gynecology

## 2021-06-24 ENCOUNTER — Encounter: Payer: Self-pay | Admitting: Obstetrics & Gynecology

## 2021-06-24 ENCOUNTER — Other Ambulatory Visit: Payer: Self-pay

## 2021-06-24 NOTE — Progress Notes (Signed)
    Post Partum Visit Note  Kayla Bradley is a 30 y.o. [phone] female who presents for a postpartum visit. She is 4 week postpartum following a  c-sectionprimary cesarean section.  I have fully reviewed the prenatal and intrapartum course. The delivery was at  38 gestational weeks.  Anesthesia: local. Postpartum course has been uncomplicated.Kayla Bradley is doing well. Baby is feeding by both breast and bottle - Similac Neosure. Bleeding staining only. Bowel function is normal. Bladder function is normal. Patient is not sexually active. Contraception method is OCP (estrogen/progesterone). Postpartum depression screening: negative   The pregnancy intention screening data noted above was reviewed. Potential methods of contraception were discussed. The patient elected to proceed with No data recorded.    Health Maintenance Due  Topic Date Due   COVID-19 Vaccine (1) Never done   URINE MICROALBUMIN  Never done   PAP SMEAR-Modifier  Never done   INFLUENZA VACCINE  05/03/2021    The following portions of the patient's history were reviewed and updated as appropriate: allergies, current medications, past family history, past medical history, past social history, past surgical history, and problem list.  Review of Systems Pertinent items are noted in HPI.  Objective:  LMP 08/27/2020 (Approximate)    General:  alert, cooperative, and no distress   Breasts:  normal  Lungs: Effort normal  Heart:  regular rate and rhythm  Abdomen: soft, non-tender; bowel sounds normal; no masses,  no organomegaly   Wound well approximated incision, 1cm X 1cm subcutaneous nodule right edge of incision likely scar   GU exam:  normal       Assessment:    There are no diagnoses linked to this encounter.  normal postpartum exam.   Plan:   Essential components of care per ACOG recommendations:  1.  Mood and well being: Patient with negative depression screening today. Reviewed local resources for support.   - Patient tobacco use? No.   - hx of drug use? No.    2. Infant care and feeding:  -Patient currently breastmilk feeding?   -Social determinants of health (SDOH) reviewed in EPIC. No concerns  3. Sexuality, contraception and birth spacing - Patient does not want a pregnancy in the next year.  Desired family size is 2 children.  - Reviewed forms of contraception in tiered fashion. Patient desired oral progesterone-only contraceptive today.   - Discussed birth spacing of 18 months  4. Sleep and fatigue -Encouraged family/partner/community support of 4 hrs of uninterrupted sleep to help with mood and fatigue  5. Physical Recovery  - Discussed patients delivery and complications. She describes her labor as good. - Patient had a C-section failure to progress.  - Patient has urinary incontinence? No. - Patient is safe to resume physical and sexual activity  6.  Health Maintenance - HM due items addressed Yes - Last pap smear No results found for: DIAGPAP Pap smear not done at today's visit. Need result from the [facility] -Breast Cancer screening indicated? No.   7. Chronic Disease/Pregnancy Condition follow up: 2 hr GTT  - PCP follow up  Adam Phenix, MD  [facility], [facility]

## 2021-07-12 ENCOUNTER — Ambulatory Visit: Payer: Medicaid Other | Admitting: Obstetrics and Gynecology

## 2021-07-12 ENCOUNTER — Other Ambulatory Visit: Payer: Medicaid Other

## 2022-02-01 ENCOUNTER — Encounter: Payer: Self-pay | Admitting: Registered Nurse

## 2022-02-01 ENCOUNTER — Ambulatory Visit: Payer: Medicaid Other | Admitting: Registered Nurse

## 2022-02-01 VITALS — BP 128/80 | HR 98 | Temp 98.8°F | Resp 16 | Ht 62.0 in | Wt 181.2 lb

## 2022-02-01 DIAGNOSIS — R0681 Apnea, not elsewhere classified: Secondary | ICD-10-CM | POA: Diagnosis not present

## 2022-02-01 DIAGNOSIS — R768 Other specified abnormal immunological findings in serum: Secondary | ICD-10-CM | POA: Diagnosis not present

## 2022-02-01 DIAGNOSIS — R5382 Chronic fatigue, unspecified: Secondary | ICD-10-CM

## 2022-02-01 NOTE — Assessment & Plan Note (Signed)
1:80 in 2018. Dr. Estanislado Pandy determined no further work up needed at that time. Will recheck along with other labs based on progressive fatigue and menstrual changes.  ?

## 2022-02-01 NOTE — Progress Notes (Signed)
? ?Established Patient Office Visit ? ?Subjective:  ?Patient ID: Kayla Bradley, female    DOB: [birth year]  Age: 31 y.o. MRN: [id number] ? ?CC:  ?Chief Complaint  ?Patient presents with  ? Weight Check  ?  Pt states she is having a hard time losing weight  ?  ? Insomnia  ?  Having some insomnia issues states she is fatigued most of the time   ? ? ?HPI ?Kayla Bradley presents for weight, insomnia ? ?Weight ?Has been pursuing diet and exercise consistently for some months. ?She has had two children within past two years - she does acknowledge the role of pregnancy in stubborn weight but is concerned for underlying issue. ?She did have a positive ANA in the past with 1:80, but per rheum did not warrant further work up after further labs reassuring. ? ?Insomnia ?Ongoing. Worse over past year or two. ?Good sleep hygiene ?Snores loudly, wakes frequently gasping, wakes tired and with headache/dry mouth. ?Fam hx of osa.  ?She has not had sleep study in past.  ? ?Heavy menses ?With bleeding between menses. ?She had C-section on May 28, 2021. Breast fed, then first menses after was around end of January.  ?Had been on COCs in past with good effect, however, with or without BCM had typically had very light and regular cycles. ?Some fatigue but no other symptoms of anemia. ?She is confident she is not pregnant.  ? ?Outpatient Medications Prior to Visit  ?Medication Sig Dispense Refill  ? Accu-Chek Softclix Lancets lancets Check blood sugars four times a day. 100 each 1  ? Blood Glucose Monitoring Suppl (ACCU-CHEK GUIDE ME) w/Device KIT See admin instructions.    ? Blood Pressure Monitoring (BLOOD PRESSURE KIT) DEVI 1 kit by Does not apply route once a week. 1 each 0  ? glucose blood (ACCU-CHEK GUIDE) test strip Use to check blood sugars four times a day was instructed 50 each 12  ? ibuprofen (ADVIL) 600 MG tablet Take 1 tablet (600 mg total) by mouth every 6 (six) hours. 30 tablet 0  ? norethindrone (ORTHO MICRONOR)  0.35 MG tablet Take 1 tablet (0.35 mg total) by mouth daily. (Patient not taking: Reported on 02/01/2022) 28 tablet 11  ? oxyCODONE (OXY IR/ROXICODONE) 5 MG immediate release tablet Take 1-2 tablets (5-10 mg total) by mouth every 4 (four) hours as needed for moderate pain. (Patient not taking: Reported on 02/01/2022) 10 tablet 0  ? Prenat-Fe Poly-Methfol-FA-DHA (VITAFOL ULTRA) 29-0.6-0.4-200 MG CAPS Take 1 capsule by mouth daily. (Patient not taking: Reported on 02/01/2022) 30 capsule 6  ? ?No facility-administered medications prior to visit.  ? ? ?Review of Systems ?Per hpi  ? ?  ?Objective:  ?  ? ?BP 128/80   Pulse 98   Temp 98.8 ?F (37.1 ?C) (Temporal)   Resp 16   Ht '5\' 2"'  (1.575 m)   Wt 181 lb 3.2 oz (82.2 kg)   SpO2 100%   BMI 33.14 kg/m?  ? ?Wt Readings from Last 3 Encounters:  ?02/01/22 181 lb 3.2 oz (82.2 kg)  ?06/24/21 175 lb (79.4 kg)  ?05/28/21 198 lb (89.8 kg)  ? ?Physical Exam ?Vitals and nursing note reviewed.  ?Constitutional:   ?   General: She is not in acute distress. ?   Appearance: Normal appearance. She is normal weight. She is not ill-appearing, toxic-appearing or diaphoretic.  ?Cardiovascular:  ?   Rate and Rhythm: Normal rate and regular rhythm.  ?   Heart sounds: Normal heart  sounds. No murmur heard. ?  No friction rub. No gallop.  ?Pulmonary:  ?   Effort: Pulmonary effort is normal. No respiratory distress.  ?   Breath sounds: Normal breath sounds. No stridor. No wheezing, rhonchi or rales.  ?Chest:  ?   Chest wall: No tenderness.  ?Skin: ?   General: Skin is warm and dry.  ?Neurological:  ?   General: No focal deficit present.  ?   Mental Status: She is alert and oriented to person, place, and time. Mental status is at baseline.  ?Psychiatric:     ?   Mood and Affect: Mood normal.     ?   Behavior: Behavior normal.     ?   Thought Content: Thought content normal.     ?   Judgment: Judgment normal.  ? ? ?No results found for any visits on 02/01/22. ? ? ? ?The ASCVD Risk score (Arnett DK,  et al., 2019) failed to calculate for the following reasons: ?  The 2019 ASCVD risk score is only valid for ages 25 to 83 ? ?  ?Assessment & Plan:  ? ?Problem List Items Addressed This Visit   ? ?  ? Other  ? Positive ANA (antinuclear antibody)  ?  1:80 in 2018. Dr. Estanislado Pandy determined no further work up needed at that time. Will recheck along with other labs based on progressive fatigue and menstrual changes.  ? ?  ?  ? Relevant Orders  ? Antinuclear Antib (ANA)  ? Chronic fatigue - Primary  ?  Labs collected. Will follow up with the patient as warranted. ?Likely that OSA, if present, playing a role ?Encouraged continued good sleep hygiene.  ? ?  ?  ? Relevant Orders  ? CBC with Differential/Platelet  ? Comprehensive metabolic panel  ? Hemoglobin A1c  ? TSH  ? T4, free  ? Iron, TIBC and Ferritin Panel  ? Antinuclear Antib (ANA)  ? Witnessed episode of apnea  ?  Refer to pulmonary for sleep study. Fits many symptoms of OSA. Discussed long term risks it poses.  ? ?  ?  ? Relevant Orders  ? Ambulatory referral to Pulmonology  ? ? ?No orders of the defined types were placed in this encounter. ? ? ?No follow-ups on file.  ? ? ?Maximiano Coss, NP ?

## 2022-02-01 NOTE — Assessment & Plan Note (Signed)
Refer to pulmonary for sleep study. Fits many symptoms of OSA. Discussed long term risks it poses.  ?

## 2022-02-01 NOTE — Patient Instructions (Addendum)
Ms. Belo -  ? ?Great to see you! ? ?Let's check labs. ? ?I'll call with concerning results ? ?I have referred to pulmonary to get a sleep study together. They'll call you to set up an appt ? ?Thank you ? ?Rich  ?

## 2022-02-01 NOTE — Assessment & Plan Note (Signed)
Labs collected. Will follow up with the patient as warranted. ?Likely that OSA, if present, playing a role ?Encouraged continued good sleep hygiene.  ?

## 2022-02-02 LAB — COMPREHENSIVE METABOLIC PANEL
ALT: 22 U/L (ref 0–35)
AST: 17 U/L (ref 0–37)
Albumin: 4.7 g/dL (ref 3.5–5.2)
Alkaline Phosphatase: 69 U/L (ref 39–117)
BUN: 12 mg/dL (ref 6–23)
CO2: 29 mEq/L (ref 19–32)
Calcium: 9.5 mg/dL (ref 8.4–10.5)
Chloride: 102 mEq/L (ref 96–112)
Creatinine, Ser: 0.78 mg/dL (ref 0.40–1.20)
GFR: 102.53 mL/min (ref >60.00)
Glucose, Bld: 91 mg/dL (ref 70–99)
Potassium: 4 mEq/L (ref 3.5–5.1)
Sodium: 139 mEq/L (ref 135–145)
Total Bilirubin: 0.4 mg/dL (ref 0.2–1.2)
Total Protein: 7.4 g/dL (ref 6.0–8.3)

## 2022-02-02 LAB — CBC WITH DIFFERENTIAL/PLATELET
Basophils Absolute: 0.1 10*3/uL (ref 0.0–0.1)
Basophils Relative: 0.9 % (ref 0.0–3.0)
Eosinophils Absolute: 0.1 10*3/uL (ref 0.0–0.7)
Eosinophils Relative: 1.7 % (ref 0.0–5.0)
HCT: 42 % (ref 36.0–46.0)
Hemoglobin: 13.9 g/dL (ref 12.0–15.0)
Lymphocytes Relative: 24.3 % (ref 12.0–46.0)
Lymphs Abs: 1.6 10*3/uL (ref 0.7–4.0)
MCHC: 33 g/dL (ref 30.0–36.0)
MCV: 86.2 fl (ref 78.0–100.0)
Monocytes Absolute: 0.4 10*3/uL (ref 0.1–1.0)
Monocytes Relative: 5.8 % (ref 3.0–12.0)
Neutro Abs: 4.6 10*3/uL (ref 1.4–7.7)
Neutrophils Relative %: 67.3 % (ref 43.0–77.0)
Platelets: 306 10*3/uL (ref 150.0–400.0)
RBC: 4.88 Mil/uL (ref 3.87–5.11)
RDW: 13.3 % (ref 11.5–15.5)
WBC: 6.8 10*3/uL (ref 4.0–10.5)

## 2022-02-02 LAB — HEMOGLOBIN A1C: Hgb A1c MFr Bld: 5.6 % (ref 4.6–6.5)

## 2022-02-02 LAB — T4, FREE: Free T4: 0.69 ng/dL (ref 0.60–1.60)

## 2022-02-02 LAB — TSH: TSH: 2.37 u[IU]/mL (ref 0.35–5.50)

## 2022-02-03 LAB — IRON,TIBC AND FERRITIN PANEL
%SAT: 16 % (calc) (ref 16–45)
Ferritin: 26 ng/mL (ref 16–154)
Iron: 60 ug/dL (ref 40–190)
TIBC: 377 mcg/dL (calc) (ref 250–450)

## 2022-02-03 LAB — ANTI-NUCLEAR AB-TITER (ANA TITER): ANA Titer 1: 1:80 {titer} — ABNORMAL HIGH

## 2022-02-03 LAB — ANA: Anti Nuclear Antibody (ANA): POSITIVE — AB

## 2022-02-07 ENCOUNTER — Encounter: Payer: Self-pay | Admitting: Registered Nurse

## 2022-02-08 NOTE — Telephone Encounter (Signed)
Pt has agreed to sleep apnea assessment but has also asked to proceed with weight loss medications should pt have return visit to discuss this? ? ?

## 2022-02-15 ENCOUNTER — Other Ambulatory Visit: Payer: Self-pay | Admitting: Registered Nurse

## 2022-02-15 DIAGNOSIS — R5382 Chronic fatigue, unspecified: Secondary | ICD-10-CM

## 2022-02-15 DIAGNOSIS — E663 Overweight: Secondary | ICD-10-CM

## 2022-02-15 MED ORDER — PHENTERMINE HCL 37.5 MG PO CAPS: 37.5000 mg | ORAL_CAPSULE | ORAL | 0 refills | Status: DC

## 2022-02-15 NOTE — Telephone Encounter (Signed)
Patient is interested in starting phentermine ?

## 2022-03-04 ENCOUNTER — Institutional Professional Consult (permissible substitution): Payer: Medicaid Other | Admitting: Plastic Surgery

## 2022-03-16 ENCOUNTER — Encounter: Payer: Self-pay | Admitting: Obstetrics and Gynecology

## 2022-03-16 ENCOUNTER — Ambulatory Visit: Payer: Medicaid Other | Admitting: Obstetrics and Gynecology

## 2022-03-16 ENCOUNTER — Other Ambulatory Visit (HOSPITAL_COMMUNITY)
Admission: RE | Admit: 2022-03-16 | Discharge: 2022-03-16 | Disposition: A | Discharge status: Home / Self Care | Payer: Medicaid Other | Source: Ambulatory Visit | Attending: Obstetrics and Gynecology | Admitting: Obstetrics and Gynecology

## 2022-03-16 DIAGNOSIS — Z01419 Encounter for gynecological examination (general) (routine) without abnormal findings: Secondary | ICD-10-CM | POA: Diagnosis present

## 2022-03-16 NOTE — Progress Notes (Addendum)
31 y.o GYN presents for PAP.  C/o intermittent abdominal pain 5/10, heavy periods before starting OCP.

## 2022-03-16 NOTE — Progress Notes (Signed)
Kayla Bradley is a 31 y.o. [phone] female here for a routine annual gynecologic exam.  Current complaints: Desires pap smear without STD testing.   Had some problems with DUB earlier this year. Has resolved with OCP's prescribed by PCP. Denies abnormal vaginal bleeding, discharge, pelvic pain, problems with intercourse or other gynecologic concerns.    Gynecologic History Patient's last menstrual period was 02/04/2022 (approximate). Contraception: oral progesterone-only contraceptive Last Pap: NA. Results were: normal Last mammogram: NA.   Obstetric History OB History  Gravida Para Term Preterm AB Living  _0 SAB IAB Ectopic Multiple Live Births        0 2    # Outcome Date GA Lbr Len/2nd Weight Sex Delivery Anes PTL Lv  2 Term 05/30/21 [redacted]w[redacted]d 8 lb 5.3 oz (3.779 kg) F CS-LTranv EPI  LIV  1 Term 02/04/20 [id number] lb (4.082 kg) M CS-LTranv   LIV    Past Medical History:  Diagnosis Date   Dental infection    Dyspnea    Gestational diabetes    Postpartum hemorrhage    VSD (ventricular septal defect)    repaired by surgery at [age]yrld    Past Surgical History:  Procedure Laterality Date   CESAREAN SECTION     CESAREAN SECTION Bilateral 05/30/2021   Procedure: CESAREAN SECTION;  Surgeon: DunRadene GunningD;  [facility];  Service: Obstetrics;  Laterality: Bilateral;   heart defect fixed     VSD REPAIR      Current Outpatient Medications on File Prior to Visit  Medication Sig Dispense Refill   Blood Pressure Monitoring (BLOOD PRESSURE KIT) DEVI 1 kit by Does not apply route once a week. 1 each 0   norethindrone (MICRONOR) 0.35 MG tablet Take 1 tablet by mouth daily.     phentermine 37.5 MG capsule Take 1 capsule (37.5 mg total) by mouth every morning. 90 capsule 0   No current facility-administered medications on file prior to visit.    Allergies  Allergen Reactions   Escitalopram Other (See Comments)    Made patient feel crazy    Social History    Socioeconomic History   Marital status: Married    Spouse name: Not on file   Number of children: 1   Years of education: Not on file   Highest education level: Not on file  Occupational History   Occupation: Host    Comment: [employer] (life sciences)Occupation: UNEMPLOYED  Tobacco Use   Smoking status: Former    Types: Cigarettes   Smokeless tobacco: Never  Vaping Use   Vaping Use: Never used  Substance and Sexual Activity   Alcohol use: Yes    Alcohol/week: 1.0 standard drink of alcohol    Types: 1 Cans of beer per week    Comment: rarely   Drug use: Not Currently    Frequency: 7.0 times per week    Types: Marijuana    Comment: 2 years ago   Sexual activity: Yes    Partners: Male    Birth control/protection: OCP  Other Topics Concern   Not on file  Social History Narrative   Not on file   Social Determinants of Health   Financial Resource Strain: Not on file  Food Insecurity: Not on file  Transportation Needs: Not on file  Physical Activity: Not on file  Stress: Not on file  Social Connections: Not on file  Intimate Partner Violence: Not on  file    Family History  Problem Relation Age of Onset   Diabetes Father    Diabetes Sister    Heart disease Maternal Grandfather     The following portions of the patient's history were reviewed and updated as appropriate: allergies, current medications, past family history, past medical history, past social history, past surgical history and problem list.  Review of Systems Pertinent items noted in HPI and remainder of comprehensive ROS otherwise negative.   Objective:  BP 114/77   Pulse 93   Ht _0  (1.575 m)   Wt 168 lb (76.2 kg)   LMP 02/04/2022 (Approximate)   BMI 30.73 kg/m  CONSTITUTIONAL: Well-developed, well-nourished female in no acute distress.  HENT:  Normocephalic, atraumatic, External right and left ear normal. Oropharynx is clear and moist EYES: Conjunctivae and EOM are normal. Pupils are equal,  round, and reactive to light. No scleral icterus.  NECK: Normal range of motion, supple, no masses.  Normal thyroid.  SKIN: Skin is warm and dry. No rash noted. Not diaphoretic. No erythema. No pallor. [place]: Alert and oriented to person, place, and time. Normal reflexes, muscle tone coordination. No cranial nerve deficit noted. PSYCHIATRIC: Normal mood and affect. Normal behavior. Normal judgment and thought content. CARDIOVASCULAR: Normal heart rate noted, regular rhythm RESPIRATORY: Clear to auscultation bilaterally. Effort and breath sounds normal, no problems with respiration noted. BREASTS: Symmetric in size. No masses, skin changes, nipple drainage, or lymphadenopathy. ABDOMEN: Soft, normal bowel sounds, no distention noted.  No tenderness, rebound or guarding.  PELVIC: Normal appearing external genitalia; normal appearing vaginal mucosa and cervix.  No abnormal discharge noted.  Pap smear obtained.  Normal uterine size, no other palpable masses, no uterine or adnexal tenderness. MUSCULOSKELETAL: Normal range of motion. No tenderness.  No cyanosis, clubbing, or edema.  2+ distal pulses.   Assessment:  Annual gynecologic examination with pap smear   Plan:  Will follow up results of pap smear and manage accordingly. Continue with OCP's as per PCP Routine preventative health maintenance measures emphasized. Please refer to After Visit Summary for other counseling recommendations.    Chancy Milroy, MD, [place] Attending [place] for [facility], [place]

## 2022-03-16 NOTE — Patient Instructions (Signed)

## 2022-03-17 LAB — CYTOLOGY - PAP: Diagnosis: NEGATIVE

## 2022-04-04 ENCOUNTER — Encounter (HOSPITAL_COMMUNITY): Payer: Self-pay

## 2022-04-04 ENCOUNTER — Ambulatory Visit (HOSPITAL_COMMUNITY)
Admission: RE | Admit: 2022-04-04 | Discharge: 2022-04-04 | Disposition: A | Discharge status: Home / Self Care | Payer: Medicaid Other | Source: Ambulatory Visit | Attending: Emergency Medicine | Admitting: Emergency Medicine

## 2022-04-04 VITALS — BP 115/80 | HR 102 | Temp 97.9°F | Resp 16 | Ht 63.0 in | Wt 162.0 lb

## 2022-04-04 DIAGNOSIS — R0602 Shortness of breath: Secondary | ICD-10-CM | POA: Diagnosis not present

## 2022-04-04 DIAGNOSIS — R42 Dizziness and giddiness: Secondary | ICD-10-CM

## 2022-04-04 DIAGNOSIS — R112 Nausea with vomiting, unspecified: Secondary | ICD-10-CM | POA: Diagnosis not present

## 2022-04-04 MED ORDER — MECLIZINE HCL 12.5 MG PO TABS: 12.5000 mg | ORAL_TABLET | Freq: Three times a day (TID) | ORAL | 0 refills | Status: DC | PRN

## 2022-04-04 MED ORDER — ONDANSETRON 4 MG PO TBDP: 4.0000 mg | ORAL_TABLET | Freq: Three times a day (TID) | ORAL | 0 refills | Status: DC | PRN

## 2022-04-04 MED ORDER — ALBUTEROL SULFATE HFA 108 (90 BASE) MCG/ACT IN AERS: 2.0000 | INHALATION_SPRAY | RESPIRATORY_TRACT | 0 refills | Status: DC | PRN

## 2022-04-04 NOTE — ED Provider Notes (Signed)
[place]    CSN: [id number] Arrival date & time: 04/04/22  1633      History   Chief Complaint Chief Complaint  Patient presents with   Dizziness    Dizziness, light headed, fatigue, shortness of breath, nausea. - Entered by patient   Fatigue   Shortness of Breath   Nausea    HPI Kayla Bradley is a 31 y.o. female.   dIzziness, lightheadedness, fatigue for 7 days, nausea starting 1 day ago,   Lightheadness began first, on occurrence of things going black, last a second before reosloving,   Dizziny feels like she is off balanced,   Sob at rest for 7 days,   Took pheternine for 1 month, stopped medicaqtion once symptoms began, drinking several body of fluids daily, typically 2-3 meals a day     Past Medical History:  Diagnosis Date   Dental infection    Dyspnea    Gestational diabetes    Postpartum hemorrhage    VSD (ventricular septal defect)    repaired by surgery at 32yr old    Patient Active Problem List   Diagnosis Date Noted   Visit for routine gyn exam 03/16/2022   Chronic fatigue 02/01/2022   Witnessed episode of apnea 02/01/2022   H/O: C-section 11/17/2020   Obesity 11/17/2020   History of congenital heart defect 11/17/2020   Asymptomatic varicose veins of both lower extremities 08/10/2020   History of open heart surgery 12/24/2019   Positive ANA (antinuclear antibody) 11/22/2016   Thoracic scoliosis 08/08/2012    Past Surgical History:  Procedure Laterality Date   CESAREAN SECTION     CESAREAN SECTION Bilateral 05/30/2021   Procedure: CESAREAN SECTION;  Surgeon: DRadene Gunning MD;  [facility]: [facility];  Service: Obstetrics;  Laterality: Bilateral;   heart defect fixed     VSD REPAIR      OB History     Gravida  2   Para  2   Term  2   Preterm      AB      Living  2      SAB      IAB      Ectopic      Multiple  0   Live Births  2            Home Medications    Prior to Admission medications    Medication Sig Start Date End Date Taking? Authorizing Provider  norethindrone (MICRONOR) 0.35 MG tablet Take 1 tablet by mouth daily. 02/21/22  Yes [provider]  phentermine 37.5 MG capsule Take 1 capsule (37.5 mg total) by mouth every morning. 02/15/22  Yes MMaximiano Coss NP  Blood Pressure Monitoring (BLOOD PRESSURE KIT) DEVI 1 kit by Does not apply route once a week. 11/11/20   DSloan Leiter MD    Family History Family History  Problem Relation Age of Onset   Diabetes Father    Diabetes Sister    Heart disease Maternal Grandfather     Social History Social History   Tobacco Use   Smoking status: Former    Types: Cigarettes   Smokeless tobacco: Never  Vaping Use   Vaping Use: Never used  Substance Use Topics   Alcohol use: Yes    Alcohol/week: 1.0 standard drink of alcohol    Types: 1 Cans of beer per week    Comment: rarely   Drug use: Not Currently    Frequency: 7.0 times per week  Types: Marijuana    Comment: 2 years ago     Allergies   Escitalopram   Review of Systems Review of Systems  Constitutional:  Positive for fatigue. Negative for activity change, appetite change, chills, diaphoresis, fever and unexpected weight change.  HENT: Negative.    Respiratory:  Positive for shortness of breath. Negative for apnea, cough, choking, chest tightness, wheezing and stridor.   Cardiovascular: Negative.   Gastrointestinal: Negative.   Skin: Negative.   Neurological:  Positive for dizziness and light-headedness. Negative for tremors, seizures, syncope, facial asymmetry, speech difficulty, weakness, numbness and headaches.     Physical Exam Triage Vital Signs ED Triage Vitals  Enc Vitals Group     BP 04/04/22 1658 115/80     Pulse Rate 04/04/22 1658 (!) 102     Resp 04/04/22 1658 16     Temp 04/04/22 1658 97.9 F (36.6 C)     Temp Source 04/04/22 1658 Oral     SpO2 04/04/22 1658 98 %     Weight 04/04/22 1656 162 lb (73.5 kg)     Height  04/04/22 1656 '5\' 3"'  (1.6 m)     Head Circumference --      Peak Flow --      Pain Score 04/04/22 1654 8     Pain Loc --      Pain Edu? --      Excl. in [place]? --    No data found.  Updated Vital Signs BP 115/80 (BP Location: Right Arm)   Pulse (!) 102   Temp 97.9 F (36.6 C) (Oral)   Resp 16   Ht '5\' 3"'  (1.6 m)   Wt 162 lb (73.5 kg)   LMP 01/30/2022 (Within Weeks)   SpO2 98%   Breastfeeding No   BMI 28.70 kg/m   Visual Acuity Right Eye Distance:   Left Eye Distance:   Bilateral Distance:    Right Eye Near:   Left Eye Near:    Bilateral Near:     Physical Exam   UC Treatments / Results  Labs (all labs ordered are listed, but only abnormal results are displayed) Labs Reviewed - No data to display  EKG   Radiology No results found.  Procedures Procedures (including critical care time)  Medications Ordered in UC Medications - No data to display  Initial Impression / Assessment and Plan / UC Course  I have reviewed the triage vital signs and the nursing notes.  Pertinent labs & imaging results that were available during my care of the patient were reviewed by me and considered in my medical decision making (see chart for details).     *** Final Clinical Impressions(s) / UC Diagnoses   Final diagnoses:  None   Discharge Instructions   None    ED Prescriptions   None    PDMP not reviewed this encounter.

## 2022-04-04 NOTE — Discharge Instructions (Addendum)
Shows that your heart is beating at a normal pace and rhythm,, some minor changes were noted to your T wave however I would like for you to watchfully wait to determine if symptoms improve with use of medication and follow-up with your cardiologist  On exam there are no abnormalities neurologically or with the cardiac or respiratory system that would indicate a more serious reason for your symptoms  At this time we will attempt to manage your symptoms and have you follow-up with your primary doctor in 1 week  You may use meclizine 3 times daily (every 8 hours), I would recommend that you take 1 tablet every morning since your symptoms have been occurring daily for the last week as a prophylactic measure  You may use nausea medication every 8 hours, place under tongue and allow to dissolve, then wait 30 minutes to an hour before attempting to eat or drink  You may use albuterol inhaler taking 2 puffs every 4 hours as needed for shortness of breath  Ensure that you are drinking adequate amounts of water to prevent dehydration  Change positions slowly to prevent and reduce occurrence of symptoms, wait at least 10 seconds before movements  Point between your visit today and your follow-up with your primary doctor if symptoms worsen you will need to go to the nearest emergency department for further evaluation and

## 2022-04-04 NOTE — ED Triage Notes (Signed)
Onset of symptoms last Monday with lightheadedness and fatigue. The nausea started yesterday, consistent all day.  Dizziness started Friday but got worse yesterday and today.  Patient states it feels like her heart is pounding sometimes, could feel her heartbeat through to her back last night.   Patient has been taking phentermine but stopped with onset of symptoms. Started medication a little over a month ago.   No known sick exposure and non has the same symptoms as Patient at home.   Patient also started oral contraceptives in April and had some fatigue with that change.

## 2022-04-11 ENCOUNTER — Inpatient Hospital Stay (HOSPITAL_COMMUNITY)
Admission: AD | Admit: 2022-04-11 | Discharge: 2022-04-11 | Disposition: A | Discharge status: Home / Self Care | Payer: Medicaid Other | Attending: Obstetrics and Gynecology | Admitting: Obstetrics and Gynecology

## 2022-04-11 ENCOUNTER — Ambulatory Visit
Admission: RE | Admit: 2022-04-11 | Discharge status: Absent without leave | Payer: Medicaid Other | Source: Ambulatory Visit | Attending: Registered Nurse | Admitting: Registered Nurse

## 2022-04-11 DIAGNOSIS — O219 Vomiting of pregnancy, unspecified: Secondary | ICD-10-CM | POA: Diagnosis not present

## 2022-04-11 DIAGNOSIS — Z3201 Encounter for pregnancy test, result positive: Secondary | ICD-10-CM

## 2022-04-11 DIAGNOSIS — Z3687 Encounter for antenatal screening for uncertain dates: Secondary | ICD-10-CM | POA: Diagnosis not present

## 2022-04-11 LAB — POCT PREGNANCY, URINE: Preg Test, Ur: POSITIVE — AB

## 2022-04-11 LAB — HCG, QUANTITATIVE, PREGNANCY: hCG, Beta Chain, Quant, S: 110370 m[IU]/mL — ABNORMAL HIGH (ref <5)

## 2022-04-11 NOTE — MAU Provider Note (Signed)
None     Chief Complaint:  Nausea and wants confirmation   Kayla Bradley is  31 y.o. [id number] at Unknown presents complaining of Nausea and wants confirmation   Worried because she started COCs 8 weeks ago (did not take a UPT prior to starting pills) d/t irregular and heavy periods since stopping breastfeeing in January.  Saw PCP for this issue, was "confident" she wasn't pregnant at that time, no pg test done.  Also had been taking phentermine. Had 2nd CS 8/22.  Obstetrical/Gynecological History: OB History     Gravida  2   Para  2   Term  2   Preterm      AB      Living  2      SAB      IAB      Ectopic      Multiple  0   Live Births  2          Past Medical History: Past Medical History:  Diagnosis Date   Dental infection    Dyspnea    Gestational diabetes    Postpartum hemorrhage    VSD (ventricular septal defect)    repaired by surgery at 31yr old    Past Surgical History: Past Surgical History:  Procedure Laterality Date   CESAREAN SECTION     CESAREAN SECTION Bilateral 05/30/2021   Procedure: CESAREAN SECTION;  Surgeon: DRadene Gunning MD;  [facility];  Service: Obstetrics;  Laterality: Bilateral;   heart defect fixed     VSD REPAIR      Family History: Family History  Problem Relation Age of Onset   Diabetes Father    Diabetes Sister    Heart disease Maternal Grandfather     Social History: Social History   Tobacco Use   Smoking status: Former    Types: Cigarettes   Smokeless tobacco: Never  Vaping Use   Vaping Use: Never used  Substance Use Topics   Alcohol use: Yes    Alcohol/week: 1.0 standard drink of alcohol    Types: 1 Cans of beer per week    Comment: rarely   Drug use: Not Currently    Frequency: 7.0 times per week    Types: Marijuana    Comment: 2 years ago    Allergies:  Allergies  Allergen Reactions   Escitalopram Other (See Comments)    Made patient feel crazy    Meds:  Medications  Prior to Admission  Medication Sig Dispense Refill Last Dose   albuterol (VENTOLIN HFA) 108 (90 Base) MCG/ACT inhaler Inhale 2 puffs into the lungs every 4 (four) hours as needed for wheezing or shortness of breath. 8 g 0    Blood Pressure Monitoring (BLOOD PRESSURE KIT) DEVI 1 kit by Does not apply route once a week. 1 each 0    meclizine (ANTIVERT) 12.5 MG tablet Take 1 tablet (12.5 mg total) by mouth 3 (three) times daily as needed for dizziness. 30 tablet 0    norethindrone (MICRONOR) 0.35 MG tablet Take 1 tablet by mouth daily.      ondansetron (ZOFRAN-ODT) 4 MG disintegrating tablet Take 1 tablet (4 mg total) by mouth every 8 (eight) hours as needed for nausea or vomiting. 20 tablet 0    phentermine 37.5 MG capsule Take 1 capsule (37.5 mg total) by mouth every morning. 90 capsule 0     Review of Systems   Constitutional: Negative for fever and chills Eyes: Negative for visual disturbances  Respiratory: Negative for shortness of breath, dyspnea Cardiovascular: Negative for chest pain or palpitations  Gastrointestinal: Negative for vomiting, diarrhea and constipation. Some nausea Genitourinary: Negative for dysuria and urgency Musculoskeletal: Negative for back pain, joint pain, myalgias.  Normal ROM  Neurological: Negative for dizziness and headaches    Physical Exam  Blood pressure 133/78, pulse 82, temperature 98.5 F (36.9 C), temperature source Oral, resp. rate 20, height 5' 2" (1.575 m), weight 75.6 kg, last menstrual period 01/30/2022, not currently breastfeeding. GENERAL: Well-developed, well-nourished female in no acute distress.  LUNGS: Normal respiratory effort HEART: Regular rate and rhythm. EXTREMITIES:no edema,   Labs: Results for orders placed or performed during the hospital encounter of 04/11/22 (from the past 24 hour(s))  Pregnancy, urine POC   Collection Time: 04/11/22  8:24 PM  Result Value Ref Range   Preg Test, Ur POSITIVE (A) NEGATIVE   Imaging Studies:   No results found.  Assessment: Kayla Bradley is  31 y.o. [id number] at Unknown presents with + UPT.  Plan: Qhcg drawn, outpt viability/dating [place] scheduled for 8/1 at [facility] (pt on vacation 7/23-7/28, so unable to attend 7/25 appt)>    Pt reassured that although there is no way to quantify fetal risk d/t exposure to COCs/phentermine in early pregnancy, any risk would be low.  Kayla Bradley 7/10/20239:41 PM

## 2022-04-11 NOTE — MAU Note (Signed)
Pt says she has not felt  good- nausea. Went to Urgent Care last Monday bc nausea - did not do UPT  Pt did UPT on Fri/Sat- at home  both positive  She takes BCP- has not missed any pills  Pt went to Urgent Care today- to confirm UPT- told they did not do that  Takes Phenteramine -

## 2022-05-03 ENCOUNTER — Other Ambulatory Visit: Payer: Self-pay | Admitting: Obstetrics and Gynecology

## 2022-05-03 ENCOUNTER — Other Ambulatory Visit: Payer: Self-pay

## 2022-05-03 ENCOUNTER — Ambulatory Visit (INDEPENDENT_AMBULATORY_CARE_PROVIDER_SITE_OTHER): Payer: Medicaid Other

## 2022-05-03 DIAGNOSIS — Z3490 Encounter for supervision of normal pregnancy, unspecified, unspecified trimester: Secondary | ICD-10-CM

## 2022-05-03 DIAGNOSIS — O3680X0 Pregnancy with inconclusive fetal viability, not applicable or unspecified: Secondary | ICD-10-CM

## 2022-05-19 ENCOUNTER — Encounter: Payer: Self-pay | Admitting: *Deleted

## 2022-05-26 ENCOUNTER — Encounter: Payer: Self-pay | Admitting: Obstetrics and Gynecology

## 2022-05-26 ENCOUNTER — Other Ambulatory Visit (HOSPITAL_COMMUNITY)
Admission: RE | Admit: 2022-05-26 | Discharge: 2022-05-26 | Disposition: A | Discharge status: Home / Self Care | Payer: Medicaid Other | Source: Ambulatory Visit | Attending: Obstetrics and Gynecology | Admitting: Obstetrics and Gynecology

## 2022-05-26 ENCOUNTER — Ambulatory Visit (INDEPENDENT_AMBULATORY_CARE_PROVIDER_SITE_OTHER): Payer: Medicaid Other | Admitting: Obstetrics and Gynecology

## 2022-05-26 VITALS — BP 124/67 | HR 87 | Wt 173.0 lb

## 2022-05-26 DIAGNOSIS — Z3480 Encounter for supervision of other normal pregnancy, unspecified trimester: Secondary | ICD-10-CM | POA: Insufficient documentation

## 2022-05-26 DIAGNOSIS — Z8632 Personal history of gestational diabetes: Secondary | ICD-10-CM

## 2022-05-26 DIAGNOSIS — Z3481 Encounter for supervision of other normal pregnancy, first trimester: Secondary | ICD-10-CM

## 2022-05-26 DIAGNOSIS — Z3402 Encounter for supervision of normal first pregnancy, second trimester: Secondary | ICD-10-CM

## 2022-05-26 DIAGNOSIS — O24419 Gestational diabetes mellitus in pregnancy, unspecified control: Secondary | ICD-10-CM | POA: Insufficient documentation

## 2022-05-26 DIAGNOSIS — Z98891 History of uterine scar from previous surgery: Secondary | ICD-10-CM

## 2022-05-26 DIAGNOSIS — Z3A13 13 weeks gestation of pregnancy: Secondary | ICD-10-CM | POA: Diagnosis not present

## 2022-05-26 DIAGNOSIS — Z8774 Personal history of (corrected) congenital malformations of heart and circulatory system: Secondary | ICD-10-CM

## 2022-05-26 DIAGNOSIS — O99211 Obesity complicating pregnancy, first trimester: Secondary | ICD-10-CM

## 2022-05-26 DIAGNOSIS — O99210 Obesity complicating pregnancy, unspecified trimester: Secondary | ICD-10-CM

## 2022-05-26 MED ORDER — ASPIRIN 81 MG PO TBEC: 81.0000 mg | DELAYED_RELEASE_TABLET | Freq: Every day | ORAL | 2 refills | Status: DC

## 2022-05-26 NOTE — Progress Notes (Signed)
Subjective:    Kayla Bradley is a [id number] [redacted]w[redacted]d being seen today for her first obstetrical visit.  Her obstetrical history is significant for  maternal obesity, previous cesarean x 2, short interval between pregnancies with last c-section in 06/2022 and history of GDM . Patient does intend to breast feed. Pregnancy history fully reviewed.  Patient reports fatigue.  Vitals:   05/26/22 1057  BP: 124/67  Pulse: 87  Weight: 173 lb (78.5 kg)    HISTORY: OB History  Gravida Para Term Preterm AB Living  3 2 2     2   SAB IAB Ectopic Multiple Live Births        0 2    # Outcome Date GA Lbr Len/2nd Weight Sex Delivery Anes PTL Lv  3 Current           2 Term 05/30/21 [redacted]w[redacted]d  8 lb 5.3 oz (3.779 kg) F CS-LTranv EPI  LIV  1 Term 02/04/20 [redacted]w[redacted]d  9 lb (4.082 kg) M CS-LTranv   LIV   Past Medical History:  Diagnosis Date  . Dental infection   . Dyspnea   . Gestational diabetes   . Postpartum hemorrhage   . VSD (ventricular septal defect)    repaired by surgery at 31yrs old   Past Surgical History:  Procedure Laterality Date  . CESAREAN SECTION    . CESAREAN SECTION Bilateral 05/30/2021   Procedure: CESAREAN SECTION;  Surgeon: 06/01/2021, MD;  Location: [facility];  Service: Obstetrics;  Laterality: Bilateral;  . heart defect fixed    . VSD REPAIR     Family History  Problem Relation Age of Onset  . Diabetes Father   . Diabetes Sister   . Heart disease Maternal Grandfather      Exam    Uterus:   14-weeks  Pelvic Exam:    Perineum: No Hemorrhoids, Normal Perineum   Vulva: normal   Vagina:  normal mucosa, normal discharge   pH:    Cervix: multiparous appearance and closed and long   Adnexa: no mass, fullness, tenderness   Bony Pelvis: gynecoid  System: Breast:  normal appearance, no masses or tenderness   Skin: normal coloration and turgor, no rashes    Neurologic: oriented, no focal deficits   Extremities: normal strength, tone, and muscle mass   HEENT extra  ocular movement intact   Mouth/Teeth mucous membranes moist, pharynx normal without lesions and dental hygiene good   Neck supple and no masses   Cardiovascular: regular rate and rhythm   Respiratory:  appears well, vitals normal, no respiratory distress, acyanotic, normal RR, chest clear, no wheezing, crepitations, rhonchi, normal symmetric air entry   Abdomen: soft, non-tender; bowel sounds normal; no masses,  no organomegaly   Urinary:       Assessment:    Pregnancy: [place] Patient Active Problem List   Diagnosis Date Noted  . Supervision of other normal pregnancy, antepartum 05/26/2022  . History of gestational diabetes 05/26/2022  . Chronic fatigue 02/01/2022  . Witnessed episode of apnea 02/01/2022  . H/O: C-section 11/17/2020  . Maternal obesity affecting pregnancy, antepartum 11/17/2020  . History of congenital heart defect 11/17/2020  . Asymptomatic varicose veins of both lower extremities 08/10/2020  . History of open heart surgery 12/24/2019  . Positive ANA (antinuclear antibody) 11/22/2016  . Thoracic scoliosis 08/08/2012        Plan:     Initial labs drawn. Prenatal vitamins. Problem list reviewed and updated. Genetic Screening discussed : panorama ordered.  Ultrasound discussed; fetal survey: ordered. Discussed plan for repeat cesarean section  A1C ordered Rx ASA provided    Follow up in 4 weeks. 50% of 30 min visit spent on counseling and coordination of care.     Yesmin Mutch 05/26/2022

## 2022-05-26 NOTE — Patient Instructions (Signed)

## 2022-05-27 LAB — OBSTETRIC PANEL
Absolute Monocytes: 375 cells/uL (ref 200–950)
Antibody Screen: NOT DETECTED
Basophils Absolute: 64 cells/uL (ref 0–200)
Basophils Relative: 0.6 %
Eosinophils Absolute: 75 cells/uL (ref 15–500)
Eosinophils Relative: 0.7 %
HCT: 38.6 % (ref 35.0–45.0)
Hemoglobin: 12.5 g/dL (ref 11.7–15.5)
Hepatitis B Surface Ag: NONREACTIVE
Lymphs Abs: 1766 cells/uL (ref 850–3900)
MCH: 29.3 pg (ref 27.0–33.0)
MCHC: 32.4 g/dL (ref 32.0–36.0)
MCV: 90.6 fL (ref 80.0–100.0)
MPV: 9.4 fL (ref 7.5–12.5)
Monocytes Relative: 3.5 %
Neutro Abs: 8421 cells/uL — ABNORMAL HIGH (ref 1500–7800)
Neutrophils Relative %: 78.7 %
Platelets: 283 10*3/uL (ref 140–400)
RBC: 4.26 10*6/uL (ref 3.80–5.10)
RDW: 13.6 % (ref 11.0–15.0)
RPR Ser Ql: NONREACTIVE
Rubella: 2.21 Index
Total Lymphocyte: 16.5 %
WBC: 10.7 10*3/uL (ref 3.8–10.8)

## 2022-05-27 LAB — HEMOGLOBIN A1C
Hgb A1c MFr Bld: 5.2 % of total Hgb (ref <5.7)
Mean Plasma Glucose: 103 mg/dL
eAG (mmol/L): 5.7 mmol/L

## 2022-05-27 LAB — GC/CHLAMYDIA PROBE AMP (~~LOC~~) NOT AT ARMC
Chlamydia: NEGATIVE
Comment: NEGATIVE
Comment: NORMAL
Neisseria Gonorrhea: NEGATIVE

## 2022-05-27 LAB — HEPATITIS C ANTIBODY: Hepatitis C Ab: NONREACTIVE

## 2022-05-27 LAB — HIV ANTIBODY (ROUTINE TESTING W REFLEX): HIV 1&2 Ab, 4th Generation: NONREACTIVE

## 2022-05-28 LAB — CULTURE, OB URINE

## 2022-05-28 LAB — URINE CULTURE, OB REFLEX

## 2022-06-13 ENCOUNTER — Institutional Professional Consult (permissible substitution): Payer: Medicaid Other | Admitting: Plastic Surgery

## 2022-06-24 ENCOUNTER — Ambulatory Visit (INDEPENDENT_AMBULATORY_CARE_PROVIDER_SITE_OTHER): Payer: Medicaid Other | Admitting: Advanced Practice Midwife

## 2022-06-24 ENCOUNTER — Encounter: Payer: Self-pay | Admitting: Advanced Practice Midwife

## 2022-06-24 VITALS — BP 134/84 | HR 97 | Wt 175.0 lb

## 2022-06-24 DIAGNOSIS — Z3A17 17 weeks gestation of pregnancy: Secondary | ICD-10-CM

## 2022-06-24 DIAGNOSIS — Z3482 Encounter for supervision of other normal pregnancy, second trimester: Secondary | ICD-10-CM

## 2022-06-24 NOTE — Progress Notes (Signed)
   PRENATAL VISIT NOTE  Subjective:  Kayla Bradley is a 31 y.o. G3P2002 at [redacted]w[redacted]d being seen today for ongoing prenatal care.  She is currently monitored for the following issues for this low-risk pregnancy and has Thoracic scoliosis; Positive ANA (antinuclear antibody); Asymptomatic varicose veins of both lower extremities; H/O: C-section; Maternal obesity affecting pregnancy, antepartum; History of congenital heart defect; History of open heart surgery; Chronic fatigue; Witnessed episode of apnea; Supervision of other normal pregnancy, antepartum; and History of gestational diabetes on their problem list.  Patient reports no complaints.  Contractions: Not present. Vag. Bleeding: None.  Movement: Absent. Denies leaking of fluid.   The following portions of the patient's history were reviewed and updated as appropriate: allergies, current medications, past family history, past medical history, past social history, past surgical history and problem list.   Objective:   Vitals:   06/24/22 1015  BP: 134/84  Pulse: 97  Weight: 175 lb (79.4 kg)    Fetal Status: Fetal Heart Rate (bpm): 144 Fundal Height: 17 cm Movement: Absent     General:  Alert, oriented and cooperative. Patient is in no acute distress.  Skin: Skin is warm and dry. No rash noted.   Cardiovascular: Normal heart rate noted  Respiratory: Normal respiratory effort, no problems with respiration noted  Abdomen: Soft, gravid, appropriate for gestational age.  Pain/Pressure: Absent     Pelvic: Cervical exam deferred        Extremities: Normal range of motion.  Edema: None  Mental Status: Normal mood and affect. Normal behavior. Normal judgment and thought content.   Assessment and Plan:  Pregnancy: G3P2002 at [redacted]w[redacted]d 1. Encounter for supervision of other normal pregnancy in second trimester - routine care  2. [redacted] weeks gestation of pregnancy   Preterm labor symptoms and general obstetric precautions including but not  limited to vaginal bleeding, contractions, leaking of fluid and fetal movement were reviewed in detail with the patient. Please refer to After Visit Summary for other counseling recommendations.   Return in about 4 weeks (around 07/22/2022).  Future Appointments  Date Time Provider [place]  07/07/2022  2:30 PM [facility] NURSE [facility]  07/07/2022  2:45 PM [facility] US[facility]  07/21/2022 10:50 AM Constant, Vickii Chafe, MD [facility]    Marcille Buffy DNP, CNM  06/24/22  12:11 PM

## 2022-07-07 ENCOUNTER — Other Ambulatory Visit: Payer: Self-pay | Admitting: *Deleted

## 2022-07-07 ENCOUNTER — Ambulatory Visit: Payer: Medicaid Other | Admitting: *Deleted

## 2022-07-07 ENCOUNTER — Ambulatory Visit: Payer: Medicaid Other

## 2022-07-07 ENCOUNTER — Ambulatory Visit: Payer: Medicaid Other | Attending: Obstetrics and Gynecology

## 2022-07-07 VITALS — BP 125/66 | HR 88

## 2022-07-07 DIAGNOSIS — O358XX0 Maternal care for other (suspected) fetal abnormality and damage, not applicable or unspecified: Secondary | ICD-10-CM | POA: Diagnosis not present

## 2022-07-07 DIAGNOSIS — O09292 Supervision of pregnancy with other poor reproductive or obstetric history, second trimester: Secondary | ICD-10-CM | POA: Insufficient documentation

## 2022-07-07 DIAGNOSIS — Z8774 Personal history of (corrected) congenital malformations of heart and circulatory system: Secondary | ICD-10-CM

## 2022-07-07 DIAGNOSIS — Z3480 Encounter for supervision of other normal pregnancy, unspecified trimester: Secondary | ICD-10-CM

## 2022-07-07 DIAGNOSIS — Z3482 Encounter for supervision of other normal pregnancy, second trimester: Secondary | ICD-10-CM

## 2022-07-07 DIAGNOSIS — O99212 Obesity complicating pregnancy, second trimester: Secondary | ICD-10-CM | POA: Insufficient documentation

## 2022-07-07 DIAGNOSIS — Z3A19 19 weeks gestation of pregnancy: Secondary | ICD-10-CM | POA: Insufficient documentation

## 2022-07-07 DIAGNOSIS — O09892 Supervision of other high risk pregnancies, second trimester: Secondary | ICD-10-CM | POA: Diagnosis not present

## 2022-07-07 DIAGNOSIS — O99892 Other specified diseases and conditions complicating childbirth: Secondary | ICD-10-CM | POA: Diagnosis not present

## 2022-07-07 DIAGNOSIS — E669 Obesity, unspecified: Secondary | ICD-10-CM | POA: Diagnosis not present

## 2022-07-07 DIAGNOSIS — O09299 Supervision of pregnancy with other poor reproductive or obstetric history, unspecified trimester: Secondary | ICD-10-CM | POA: Diagnosis not present

## 2022-07-07 DIAGNOSIS — Z363 Encounter for antenatal screening for malformations: Secondary | ICD-10-CM | POA: Insufficient documentation

## 2022-07-07 DIAGNOSIS — Z8632 Personal history of gestational diabetes: Secondary | ICD-10-CM

## 2022-07-07 DIAGNOSIS — Z362 Encounter for other antenatal screening follow-up: Secondary | ICD-10-CM

## 2022-07-14 ENCOUNTER — Telehealth: Payer: Self-pay | Admitting: Genetics

## 2022-07-14 LAB — PANORAMA PRENATAL TEST FULL PANEL:PANORAMA TEST PLUS 5 ADDITIONAL MICRODELETIONS: FETAL FRACTION: 6.4

## 2022-07-14 NOTE — Telephone Encounter (Signed)
Called Kayla Bradley to return cfDNA screening results. Left voicemail with [facility] call back number.

## 2022-07-14 NOTE — Telephone Encounter (Signed)
Krisha was contacted by telephone to review their cell-free DNA screening (cfDNA) result. The result is low risk. This screening significantly reduces the risk that the current pregnancy has Down syndrome, Trisomy 54, Trisomy 13, Monosomy X, and Triploidy. Debie understands that this is a screening and not a diagnostic test. All questions answered.

## 2022-07-21 ENCOUNTER — Ambulatory Visit (INDEPENDENT_AMBULATORY_CARE_PROVIDER_SITE_OTHER): Payer: Medicaid Other | Admitting: Obstetrics and Gynecology

## 2022-07-21 ENCOUNTER — Encounter: Payer: Self-pay | Admitting: Obstetrics and Gynecology

## 2022-07-21 VITALS — BP 123/79 | HR 86 | Wt 179.0 lb

## 2022-07-21 DIAGNOSIS — Z98891 History of uterine scar from previous surgery: Secondary | ICD-10-CM

## 2022-07-21 DIAGNOSIS — Z8774 Personal history of (corrected) congenital malformations of heart and circulatory system: Secondary | ICD-10-CM

## 2022-07-21 DIAGNOSIS — Z3A21 21 weeks gestation of pregnancy: Secondary | ICD-10-CM

## 2022-07-21 DIAGNOSIS — O99212 Obesity complicating pregnancy, second trimester: Secondary | ICD-10-CM

## 2022-07-21 DIAGNOSIS — O99210 Obesity complicating pregnancy, unspecified trimester: Secondary | ICD-10-CM

## 2022-07-21 DIAGNOSIS — Z3482 Encounter for supervision of other normal pregnancy, second trimester: Secondary | ICD-10-CM

## 2022-07-21 DIAGNOSIS — Z8632 Personal history of gestational diabetes: Secondary | ICD-10-CM

## 2022-07-21 DIAGNOSIS — Z3480 Encounter for supervision of other normal pregnancy, unspecified trimester: Secondary | ICD-10-CM

## 2022-07-21 MED ORDER — COMFORT FIT MATERNITY SUPP MED MISC: 0 refills | Status: DC

## 2022-07-21 NOTE — Progress Notes (Signed)
   PRENATAL VISIT NOTE  Subjective:  Kayla Bradley is a 31 y.o. G3P2002 at [redacted]w[redacted]d being seen today for ongoing prenatal care.  She is currently monitored for the following issues for this high-risk pregnancy and has Thoracic scoliosis; Positive ANA (antinuclear antibody); Asymptomatic varicose veins of both lower extremities; H/O: C-section; Maternal obesity affecting pregnancy, antepartum; History of congenital heart defect; History of open heart surgery; Chronic fatigue; Witnessed episode of apnea; Supervision of other normal pregnancy, antepartum; and History of gestational diabetes on their problem list.  Patient reports no complaints.  Contractions: Not present. Vag. Bleeding: None.  Movement: Present. Denies leaking of fluid.   The following portions of the patient's history were reviewed and updated as appropriate: allergies, current medications, past family history, past medical history, past social history, past surgical history and problem list.   Objective:   Vitals:   07/21/22 1055  BP: 123/79  Pulse: 86  Weight: 179 lb (81.2 kg)    Fetal Status: Fetal Heart Rate (bpm): 153 Fundal Height: 20 cm Movement: Present     General:  Alert, oriented and cooperative. Patient is in no acute distress.  Skin: Skin is warm and dry. No rash noted.   Cardiovascular: Normal heart rate noted  Respiratory: Normal respiratory effort, no problems with respiration noted  Abdomen: Soft, gravid, appropriate for gestational age.  Pain/Pressure: Present     Pelvic: Cervical exam deferred        Extremities: Normal range of motion.  Edema: None  Mental Status: Normal mood and affect. Normal behavior. Normal judgment and thought content.   Assessment and Plan:  Pregnancy: G3P2002 at [redacted]w[redacted]d 1. Supervision of other normal pregnancy, antepartum Patient is doing well reporting some pelvic pressure with prolonged standing Rx maternity support belt provided Follow up growth ultrasound 11/2  2.  History of congenital heart defect Scheduled for fetal echo on 11/9  3. History of gestational diabetes   4. Obesity affecting pregnancy, antepartum, unspecified obesity type Continue ASA  5. H/O: C-section Will be scheduled for repeat  Preterm labor symptoms and general obstetric precautions including but not limited to vaginal bleeding, contractions, leaking of fluid and fetal movement were reviewed in detail with the patient. Please refer to After Visit Summary for other counseling recommendations.   Return in about 4 weeks (around 08/18/2022) for in person, ROB, High risk.  Future Appointments  Date Time Provider [place]  08/04/2022 12:45 PM [facility] NURSE [facility]  08/04/2022  1:00 PM [facility] US1 [facility]  08/18/2022 11:10 AM Radene Gunning, MD [facility]    Mora Bellman, MD

## 2022-08-04 ENCOUNTER — Ambulatory Visit: Payer: Medicaid Other | Attending: Obstetrics

## 2022-08-04 ENCOUNTER — Other Ambulatory Visit: Payer: Self-pay | Admitting: *Deleted

## 2022-08-04 ENCOUNTER — Ambulatory Visit: Payer: Medicaid Other | Admitting: *Deleted

## 2022-08-04 VITALS — BP 128/75 | HR 97

## 2022-08-04 DIAGNOSIS — O09292 Supervision of pregnancy with other poor reproductive or obstetric history, second trimester: Secondary | ICD-10-CM | POA: Insufficient documentation

## 2022-08-04 DIAGNOSIS — Z3A23 23 weeks gestation of pregnancy: Secondary | ICD-10-CM | POA: Diagnosis not present

## 2022-08-04 DIAGNOSIS — O09892 Supervision of other high risk pregnancies, second trimester: Secondary | ICD-10-CM

## 2022-08-04 DIAGNOSIS — O99892 Other specified diseases and conditions complicating childbirth: Secondary | ICD-10-CM | POA: Diagnosis present

## 2022-08-04 DIAGNOSIS — Z362 Encounter for other antenatal screening follow-up: Secondary | ICD-10-CM | POA: Insufficient documentation

## 2022-08-04 DIAGNOSIS — Z8632 Personal history of gestational diabetes: Secondary | ICD-10-CM

## 2022-08-04 DIAGNOSIS — Z3480 Encounter for supervision of other normal pregnancy, unspecified trimester: Secondary | ICD-10-CM | POA: Insufficient documentation

## 2022-08-17 DIAGNOSIS — O4440 Low lying placenta NOS or without hemorrhage, unspecified trimester: Secondary | ICD-10-CM | POA: Insufficient documentation

## 2022-08-17 NOTE — Progress Notes (Unsigned)
   PRENATAL VISIT NOTE  Subjective:  Kayla Bradley is a 31 y.o. G3P2002 at [redacted]w[redacted]d being seen today for ongoing prenatal care.  She is currently monitored for the following issues for this high-risk pregnancy and has Thoracic scoliosis; Positive ANA (antinuclear antibody); Asymptomatic varicose veins of both lower extremities; H/O: C-section; Maternal obesity affecting pregnancy, antepartum; History of congenital heart defect; History of open heart surgery; Chronic fatigue; Witnessed episode of apnea; Supervision of other normal pregnancy, antepartum; and History of gestational diabetes on their problem list.  Patient reports {[id number]}.   .  .   . Denies leaking of fluid.   The following portions of the patient's history were reviewed and updated as appropriate: allergies, current medications, past family history, past medical history, past social history, past surgical history and problem list.   Objective:  There were no vitals filed for this visit.  Fetal Status:           General:  Alert, oriented and cooperative. Patient is in no acute distress.  Skin: Skin is warm and dry. No rash noted.   Cardiovascular: Normal heart rate noted  Respiratory: Normal respiratory effort, no problems with respiration noted  Abdomen: Soft, gravid, appropriate for gestational age.        Pelvic: Cervical exam deferred        Extremities: Normal range of motion.     Mental Status: Normal mood and affect. Normal behavior. Normal judgment and thought content.   Assessment and Plan:  Pregnancy: G3P2002 at [redacted]w[redacted]d 1. Supervision of other normal pregnancy, antepartum 28 wk labs next time Rh positive Offer tdap next appt Offered and recommended flu shot- pt ***  2. History of gestational diabetes Early A1c normal.  Check 2 hr next time  3. H/O: C-section Desires repeat - note sent to scheduler to set for 39w ***  4. Obesity affecting pregnancy, antepartum, unspecified obesity type Normal growth  on 08/04/22  5. History of congenital heart defect Normal fetal echo She had likely VSD repair   6. History of open heart surgery  7. Low lying placenta - f/u with MFM will be scheduled for 28-32 weeks   Preterm labor symptoms and general obstetric precautions including but not limited to vaginal bleeding, contractions, leaking of fluid and fetal movement were reviewed in detail with the patient. Please refer to After Visit Summary for other counseling recommendations.   No follow-ups on file.  Future Appointments  Date Time Provider Department Center  08/18/2022 11:10 AM Milas Hock, MD [facility]  09/15/2022 10:45 AM [facility] NURSE [facility]  09/15/2022 11:00 AM [facility] US1 [facility]    Milas Hock, MD

## 2022-08-18 ENCOUNTER — Ambulatory Visit (INDEPENDENT_AMBULATORY_CARE_PROVIDER_SITE_OTHER): Payer: Medicaid Other | Admitting: Obstetrics and Gynecology

## 2022-08-18 ENCOUNTER — Encounter: Payer: Self-pay | Admitting: Obstetrics and Gynecology

## 2022-08-18 VITALS — BP 128/81 | HR 90 | Wt 188.0 lb

## 2022-08-18 DIAGNOSIS — Z23 Encounter for immunization: Secondary | ICD-10-CM

## 2022-08-18 DIAGNOSIS — O4440 Low lying placenta NOS or without hemorrhage, unspecified trimester: Secondary | ICD-10-CM

## 2022-08-18 DIAGNOSIS — O4442 Low lying placenta NOS or without hemorrhage, second trimester: Secondary | ICD-10-CM

## 2022-08-18 DIAGNOSIS — Z3480 Encounter for supervision of other normal pregnancy, unspecified trimester: Secondary | ICD-10-CM

## 2022-08-18 DIAGNOSIS — Z3482 Encounter for supervision of other normal pregnancy, second trimester: Secondary | ICD-10-CM

## 2022-08-18 DIAGNOSIS — Z98891 History of uterine scar from previous surgery: Secondary | ICD-10-CM

## 2022-08-18 DIAGNOSIS — Z8632 Personal history of gestational diabetes: Secondary | ICD-10-CM

## 2022-08-18 DIAGNOSIS — O99210 Obesity complicating pregnancy, unspecified trimester: Secondary | ICD-10-CM

## 2022-08-18 DIAGNOSIS — Z3A25 25 weeks gestation of pregnancy: Secondary | ICD-10-CM

## 2022-08-18 DIAGNOSIS — Z9889 Other specified postprocedural states: Secondary | ICD-10-CM

## 2022-08-18 DIAGNOSIS — O99212 Obesity complicating pregnancy, second trimester: Secondary | ICD-10-CM

## 2022-08-18 DIAGNOSIS — Z8774 Personal history of (corrected) congenital malformations of heart and circulatory system: Secondary | ICD-10-CM

## 2022-08-18 NOTE — Addendum Note (Signed)
Addended by: Kathie Dike on: 08/18/2022 11:41 AM   Modules accepted: Orders

## 2022-09-04 NOTE — Progress Notes (Unsigned)
   PRENATAL VISIT NOTE  Subjective:  Kayla Bradley is a 31 y.o. G3P2002 at [redacted]w[redacted]d being seen today for ongoing prenatal care.  She is currently monitored for the following issues for this low-risk pregnancy and has Thoracic scoliosis; Positive ANA (antinuclear antibody); Asymptomatic varicose veins of both lower extremities; H/O: C-section; Maternal obesity affecting pregnancy, antepartum; History of congenital heart defect; History of open heart surgery; Chronic fatigue; Witnessed episode of apnea; Supervision of other normal pregnancy, antepartum; History of gestational diabetes; and Low-lying placenta on their problem list.  Patient reports no complaints.  Contractions: Not present. Vag. Bleeding: None.  Movement: Present. Denies leaking of fluid.   The following portions of the patient's history were reviewed and updated as appropriate: allergies, current medications, past family history, past medical history, past social history, past surgical history and problem list.   Objective:   Vitals:   09/08/22 0854  BP: 115/74  Pulse: 89  Weight: 190 lb (86.2 kg)    Fetal Status: Fetal Heart Rate (bpm): 145 Fundal Height: 28 cm Movement: Present     General:  Alert, oriented and cooperative. Patient is in no acute distress.  Skin: Skin is warm and dry. No rash noted.   Cardiovascular: Normal heart rate noted  Respiratory: Normal respiratory effort, no problems with respiration noted  Abdomen: Soft, gravid, appropriate for gestational age.  Pain/Pressure: Absent     Pelvic: Cervical exam deferred        Extremities: Normal range of motion.  Edema: None  Mental Status: Normal mood and affect. Normal behavior. Normal judgment and thought content.   Assessment and Plan:  Pregnancy: G3P2002 at [redacted]w[redacted]d 1. Low-lying placenta Follow up US is on 12/14.   2. History of gestational diabetes  3. Supervision of other normal pregnancy, antepartum Offered and recommended Tdap -pt accepts Rh  pos, 28 weeks labs today.   4. Obesity affecting pregnancy, antepartum, unspecified obesity type  5. H/O: C-section Scheduled for repeat and likely ppIUD placement.   6. Scoliosis of thoracic spine, unspecified scoliosis type Clarified if any history of surgery or issues with spinal in the past - none. Regional has worked well for her.   Preterm labor symptoms and general obstetric precautions including but not limited to vaginal bleeding, contractions, leaking of fluid and fetal movement were reviewed in detail with the patient. Please refer to After Visit Summary for other counseling recommendations.   Return in about 2 weeks (around 09/22/2022) for OB VISIT, MD or APP.  Future Appointments  Date Time Provider Department Center  09/15/2022 10:45 AM [facility] NURSE [facility]  09/15/2022 11:00 AM [facility] US1 [facility]  09/22/2022 10:50 AM Myna Hidalgo, DO [facility]    Milas Hock, MD

## 2022-09-08 ENCOUNTER — Encounter: Payer: Self-pay | Admitting: Obstetrics and Gynecology

## 2022-09-08 ENCOUNTER — Ambulatory Visit (INDEPENDENT_AMBULATORY_CARE_PROVIDER_SITE_OTHER): Payer: Medicaid Other | Admitting: Obstetrics and Gynecology

## 2022-09-08 VITALS — BP 115/74 | HR 89 | Wt 190.0 lb

## 2022-09-08 DIAGNOSIS — O99213 Obesity complicating pregnancy, third trimester: Secondary | ICD-10-CM | POA: Diagnosis not present

## 2022-09-08 DIAGNOSIS — Z8632 Personal history of gestational diabetes: Secondary | ICD-10-CM

## 2022-09-08 DIAGNOSIS — O99210 Obesity complicating pregnancy, unspecified trimester: Secondary | ICD-10-CM

## 2022-09-08 DIAGNOSIS — O4443 Low lying placenta NOS or without hemorrhage, third trimester: Secondary | ICD-10-CM | POA: Diagnosis not present

## 2022-09-08 DIAGNOSIS — M419 Scoliosis, unspecified: Secondary | ICD-10-CM

## 2022-09-08 DIAGNOSIS — Z23 Encounter for immunization: Secondary | ICD-10-CM

## 2022-09-08 DIAGNOSIS — Z3483 Encounter for supervision of other normal pregnancy, third trimester: Secondary | ICD-10-CM | POA: Diagnosis not present

## 2022-09-08 DIAGNOSIS — Z3480 Encounter for supervision of other normal pregnancy, unspecified trimester: Secondary | ICD-10-CM

## 2022-09-08 DIAGNOSIS — O4440 Low lying placenta NOS or without hemorrhage, unspecified trimester: Secondary | ICD-10-CM

## 2022-09-08 DIAGNOSIS — O24419 Gestational diabetes mellitus in pregnancy, unspecified control: Secondary | ICD-10-CM

## 2022-09-08 DIAGNOSIS — Z98891 History of uterine scar from previous surgery: Secondary | ICD-10-CM

## 2022-09-09 ENCOUNTER — Encounter: Payer: Self-pay | Admitting: Obstetrics and Gynecology

## 2022-09-09 ENCOUNTER — Telehealth: Payer: Self-pay | Admitting: *Deleted

## 2022-09-09 LAB — CBC
HCT: 37.2 % (ref 35.0–45.0)
Hemoglobin: 12.7 g/dL (ref 11.7–15.5)
MCH: 30.5 pg (ref 27.0–33.0)
MCHC: 34.1 g/dL (ref 32.0–36.0)
MCV: 89.2 fL (ref 80.0–100.0)
MPV: 10.2 fL (ref 7.5–12.5)
Platelets: 259 10*3/uL (ref 140–400)
RBC: 4.17 10*6/uL (ref 3.80–5.10)
RDW: 12.5 % (ref 11.0–15.0)
WBC: 11.6 10*3/uL — ABNORMAL HIGH (ref 3.8–10.8)

## 2022-09-09 LAB — 2HR GTT W 1 HR, CARPENTER, 75 G
Glucose, 1 Hr, Gest: 185 mg/dL — ABNORMAL HIGH (ref 65–179)
Glucose, 2 Hr, Gest: 154 mg/dL — ABNORMAL HIGH (ref 65–152)
Glucose, Fasting, Gest: 91 mg/dL (ref 65–91)

## 2022-09-09 LAB — RPR: RPR Ser Ql: NONREACTIVE

## 2022-09-09 LAB — HIV ANTIBODY (ROUTINE TESTING W REFLEX): HIV 1&2 Ab, 4th Generation: NONREACTIVE

## 2022-09-09 MED ORDER — ACCU-CHEK GUIDE W/DEVICE KIT: PACK | 0 refills | Status: DC

## 2022-09-09 MED ORDER — ACCU-CHEK SOFTCLIX LANCETS MISC: 1.0000 | Freq: Four times a day (QID) | 12 refills | Status: DC

## 2022-09-09 MED ORDER — ACCU-CHEK GUIDE VI STRP: ORAL_STRIP | 12 refills | Status: DC

## 2022-09-09 NOTE — Telephone Encounter (Signed)
Left patient a message to call and schedule rest of appointments.

## 2022-09-09 NOTE — Addendum Note (Signed)
Addended by: Mariel Aloe L on: 09/09/2022 09:00 AM   Modules accepted: Orders

## 2022-09-15 ENCOUNTER — Ambulatory Visit: Payer: Medicaid Other

## 2022-09-15 ENCOUNTER — Other Ambulatory Visit: Payer: Self-pay | Admitting: *Deleted

## 2022-09-15 ENCOUNTER — Ambulatory Visit: Payer: Medicaid Other | Attending: Obstetrics

## 2022-09-15 VITALS — BP 126/65 | HR 92

## 2022-09-15 DIAGNOSIS — O09892 Supervision of other high risk pregnancies, second trimester: Secondary | ICD-10-CM | POA: Insufficient documentation

## 2022-09-15 DIAGNOSIS — O99893 Other specified diseases and conditions complicating puerperium: Secondary | ICD-10-CM | POA: Insufficient documentation

## 2022-09-15 DIAGNOSIS — Z8632 Personal history of gestational diabetes: Secondary | ICD-10-CM | POA: Insufficient documentation

## 2022-09-15 DIAGNOSIS — O09893 Supervision of other high risk pregnancies, third trimester: Secondary | ICD-10-CM | POA: Diagnosis not present

## 2022-09-15 DIAGNOSIS — O24410 Gestational diabetes mellitus in pregnancy, diet controlled: Secondary | ICD-10-CM

## 2022-09-15 DIAGNOSIS — Z3A29 29 weeks gestation of pregnancy: Secondary | ICD-10-CM | POA: Diagnosis not present

## 2022-09-15 DIAGNOSIS — O99891 Other specified diseases and conditions complicating pregnancy: Secondary | ICD-10-CM

## 2022-09-15 DIAGNOSIS — O34219 Maternal care for unspecified type scar from previous cesarean delivery: Secondary | ICD-10-CM

## 2022-09-15 DIAGNOSIS — O24419 Gestational diabetes mellitus in pregnancy, unspecified control: Secondary | ICD-10-CM

## 2022-09-15 DIAGNOSIS — Z3480 Encounter for supervision of other normal pregnancy, unspecified trimester: Secondary | ICD-10-CM

## 2022-09-15 DIAGNOSIS — O09293 Supervision of pregnancy with other poor reproductive or obstetric history, third trimester: Secondary | ICD-10-CM | POA: Diagnosis not present

## 2022-09-15 DIAGNOSIS — Z98891 History of uterine scar from previous surgery: Secondary | ICD-10-CM

## 2022-09-15 DIAGNOSIS — O09292 Supervision of pregnancy with other poor reproductive or obstetric history, second trimester: Secondary | ICD-10-CM | POA: Insufficient documentation

## 2022-09-15 DIAGNOSIS — Q210 Ventricular septal defect: Secondary | ICD-10-CM

## 2022-09-15 DIAGNOSIS — O4440 Low lying placenta NOS or without hemorrhage, unspecified trimester: Secondary | ICD-10-CM

## 2022-09-22 ENCOUNTER — Ambulatory Visit (INDEPENDENT_AMBULATORY_CARE_PROVIDER_SITE_OTHER): Payer: Medicaid Other | Admitting: Obstetrics & Gynecology

## 2022-09-22 ENCOUNTER — Encounter: Payer: Self-pay | Admitting: Obstetrics & Gynecology

## 2022-09-22 VITALS — BP 124/66 | HR 99 | Wt 193.0 lb

## 2022-09-22 DIAGNOSIS — O24419 Gestational diabetes mellitus in pregnancy, unspecified control: Secondary | ICD-10-CM

## 2022-09-22 DIAGNOSIS — O0993 Supervision of high risk pregnancy, unspecified, third trimester: Secondary | ICD-10-CM

## 2022-09-22 DIAGNOSIS — Z3A30 30 weeks gestation of pregnancy: Secondary | ICD-10-CM

## 2022-09-22 DIAGNOSIS — Z98891 History of uterine scar from previous surgery: Secondary | ICD-10-CM

## 2022-09-22 DIAGNOSIS — Z91199 Patient's noncompliance with other medical treatment and regimen due to unspecified reason: Secondary | ICD-10-CM

## 2022-09-22 NOTE — Progress Notes (Signed)
[facility] PREGNANCY VISIT Patient name: Kayla Bradley MRN [id number]  Date of birth: [birth year] Chief Complaint:   Routine Prenatal Visit  History of Present Illness:   Kayla Bradley is a 31 y.o. G27P2002 female at 35w4dwith an Estimated Date of Delivery: 11/27/22 being seen today for ongoing management of a high-risk pregnancy complicated by:  -GDMA1 Noncompliant with log, checking irregularly  She notes she could not recall what the sugars were supposed to be  Today she reports occasional contractions.   Contractions: Not present. Vag. Bleeding: None.  Movement: Present. denies leaking of fluid.      05/26/2022   11:27 AM 03/16/2022    9:38 AM 02/01/2022    2:45 PM 03/25/2021   11:14 AM 11/11/2020    1:40 PM  Depression screen PHQ 2/9  Decreased Interest 0 0 0  0  Down, Depressed, Hopeless 0 0 0  0  PHQ - 2 Score 0 0 0  0  Altered sleeping _0 Tired, decreased energy 2 0 3 3   Change in appetite 0 0 0    Feeling bad or failure about yourself  0 0 0    Trouble concentrating 0 0 1    Moving slowly or fidgety/restless 0  0    Suicidal thoughts 0 0 0    PHQ-9 Score _1 Difficult doing work/chores  Not difficult at all Somewhat difficult       Current Outpatient Medications  Medication Instructions   Accu-Chek Softclix Lancets lancets 1 each, Other, 4 times daily, Use as instructed   albuterol (VENTOLIN HFA) 108 (90 Base) MCG/ACT inhaler 2 puffs, Inhalation, Every 4 hours PRN   aspirin EC 81 mg, Oral, Daily, Take after 12 weeks for prevention of preeclampsia later in pregnancy   Blood Glucose Monitoring Suppl (ACCU-CHEK GUIDE) w/Device KIT Use device to check blood glucose 4 times daily   Blood Pressure Monitoring (BLOOD PRESSURE KIT) DEVI 1 kit, Does not apply, Weekly   Elastic Bandages & Supports (COMFORT FIT MATERNITY SUPP MED) MISC Wear daily when ambulating   glucose blood (ACCU-CHEK GUIDE) test strip Use to check blood glucose levels 4 times daily    prenatal vitamin w/FE, FA (PRENATAL 1 + 1) 27-1 MG TABS tablet 1 tablet, Oral, Daily     Review of Systems:   Pertinent items are noted in HPI Denies abnormal vaginal discharge w/ itching/odor/irritation, headaches, visual changes, shortness of breath, chest pain, abdominal pain, severe nausea/vomiting, or problems with urination or bowel movements unless otherwise stated above. Pertinent History Reviewed:  Reviewed past medical,surgical, social, obstetrical and family history.  Reviewed problem list, medications and allergies. Physical Assessment:   Vitals:   09/22/22 1054  BP: 124/66  Pulse: 99  Weight: 193 lb (87.5 kg)  Body mass index is 35.3 kg/m.           Physical Examination:   General appearance: alert, well appearing, and in no distress  Mental status: normal mood, behavior, speech, dress, motor activity, and thought processes  Skin: warm & dry   Extremities: Edema: None    Cardiovascular: normal heart rate noted  Respiratory: normal respiratory effort, no distress  Abdomen: gravid, soft, non-tender  Pelvic: Cervical exam deferred         Fetal Status: Fetal Heart Rate (bpm): 143   Movement: Present    Fetal Surveillance Testing today: doppler   Chaperone: N/A    No results found for  this or any previous visit (from the past 24 hour(s)).   Assessment & Plan:  High-risk pregnancy: G3P2002 at 11w4dwith an Estimated Date of Delivery: 11/27/22   1) GDMA1 -reviewed expected goals of sugars and encouraged pt to bring log regularly _0  suspect pt may need metformin at next visit -growth scheduled with MFM  2) Prior C-section -scheduled for repeat @ 39wks -if possible pt would like to move up by a few days- reviewed that this could only happen if medically indicated  Meds: No orders of the defined types were placed in this encounter.   Labs/procedures today: doppler  Treatment Plan:  as outlined above  Reviewed: Preterm labor symptoms and general obstetric  precautions including but not limited to vaginal bleeding, contractions, leaking of fluid and fetal movement were reviewed in detail with the patient.  All questions were answered.   Follow-up: Return for as scheduled in 1 wk.   Future Appointments  Date Time Provider [place] 10/06/2022  3:10 PM DRadene Gunning MD [facility] 10/19/2022  8:50 AM FInez Catalina MD [facility] 10/20/2022 10:15 AM [facility] 10/20/2022 10:30 AM [facility] 10/27/2022  9:15 AM [facility] 10/27/2022  9:30 AM [facility] 11/03/2022 10:10 AM Constant, Peggy, MD [facility]    No orders of the defined types were placed in this encounter.   JJanyth Pupa DO Attending OFree[place] [facility] WDe[facility] [place]

## 2022-10-06 ENCOUNTER — Ambulatory Visit (INDEPENDENT_AMBULATORY_CARE_PROVIDER_SITE_OTHER): Payer: Medicaid Other | Admitting: Obstetrics and Gynecology

## 2022-10-06 VITALS — BP 138/81 | HR 86 | Wt 195.0 lb

## 2022-10-06 DIAGNOSIS — Z3483 Encounter for supervision of other normal pregnancy, third trimester: Secondary | ICD-10-CM

## 2022-10-06 DIAGNOSIS — O4440 Low lying placenta NOS or without hemorrhage, unspecified trimester: Secondary | ICD-10-CM

## 2022-10-06 DIAGNOSIS — Z3A32 32 weeks gestation of pregnancy: Secondary | ICD-10-CM

## 2022-10-06 DIAGNOSIS — Z98891 History of uterine scar from previous surgery: Secondary | ICD-10-CM

## 2022-10-06 DIAGNOSIS — O24410 Gestational diabetes mellitus in pregnancy, diet controlled: Secondary | ICD-10-CM

## 2022-10-06 DIAGNOSIS — O4443 Low lying placenta NOS or without hemorrhage, third trimester: Secondary | ICD-10-CM

## 2022-10-06 DIAGNOSIS — Z3480 Encounter for supervision of other normal pregnancy, unspecified trimester: Secondary | ICD-10-CM

## 2022-10-06 MED ORDER — METFORMIN HCL 500 MG PO TABS: 500.0000 mg | ORAL_TABLET | Freq: Two times a day (BID) | ORAL | 5 refills | Status: DC

## 2022-10-06 NOTE — Progress Notes (Signed)
Pt c/o pain near incision site from last c-section

## 2022-10-06 NOTE — Progress Notes (Signed)
   PRENATAL VISIT NOTE  Subjective:  Kayla Bradley is a 32 y.o. G3P2002 at [redacted]w[redacted]d being seen today for ongoing prenatal care.  She is currently monitored for the following issues for this high-risk pregnancy and has Thoracic scoliosis; Positive ANA (antinuclear antibody); Asymptomatic varicose veins of both lower extremities; H/O: C-section; Maternal obesity affecting pregnancy, antepartum; History of congenital heart defect; History of open heart surgery; Chronic fatigue; Witnessed episode of apnea; Supervision of other normal pregnancy, antepartum; Gestational diabetes; and Low-lying placenta on their problem list.  Patient reports no complaints.  Contractions: Not present. Vag. Bleeding: None.  Movement: Present. Denies leaking of fluid.   The following portions of the patient's history were reviewed and updated as appropriate: allergies, current medications, past family history, past medical history, past social history, past surgical history and problem list.   Objective:   Vitals:   10/06/22 1518  BP: 138/81  Pulse: 86  Weight: 195 lb (88.5 kg)    Fetal Status: Fetal Heart Rate (bpm): 146 Fundal Height: 34 cm Movement: Present     General:  Alert, oriented and cooperative. Patient is in no acute distress.  Skin: Skin is warm and dry. No rash noted.   Cardiovascular: Normal heart rate noted  Respiratory: Normal respiratory effort, no problems with respiration noted  Abdomen: Soft, gravid, appropriate for gestational age.  Pain/Pressure: Absent     Pelvic: Cervical exam deferred        Extremities: Normal range of motion.  Edema: None  Mental Status: Normal mood and affect. Normal behavior. Normal judgment and thought content.   Assessment and Plan:  Pregnancy: G3P2002 at [redacted]w[redacted]d 1. Supervision of other normal pregnancy, antepartum Discussed RSV vaccine - information given.   2. Low-lying placenta Persistent as of last [place]. Plan is for TVUS if unable to assess at next [place] on  1/18.   3. H/O: C-section Plan is for repeat c-section scheduled at 39w but reviewed if poor control may need to move up.   4. Diet controlled gestational diabetes mellitus (GDM) in third trimester - CBGs reviewed: Fastings range from 90-104, PP range  - Recommend MTF 500 BID - Fetal growth normal on 12/14 with normal AFI. 79%ile and AC was 92%ile, measuring similar to [facility].   Preterm labor symptoms and general obstetric precautions including but not limited to vaginal bleeding, contractions, leaking of fluid and fetal movement were reviewed in detail with the patient. Please refer to After Visit Summary for other counseling recommendations.   Return in about 2 weeks (around 10/20/2022) for OB VISIT, MD only.  Future Appointments  Date Time Provider [place]  10/19/2022  8:50 AM Inez Catalina, MD [facility]  10/20/2022 10:15 AM [facility] NURSE [facility]  10/20/2022 10:30 AM [facility] US2 [facility]  10/27/2022  9:15 AM [facility] NURSE [facility]  10/27/2022  9:30 AM [facility] US3 [facility]  11/03/2022 10:10 AM Constant, Vickii Chafe, MD [facility]    Radene Gunning, MD

## 2022-10-11 ENCOUNTER — Telehealth: Payer: Self-pay | Admitting: *Deleted

## 2022-10-11 NOTE — Telephone Encounter (Signed)
Returned call from 4:04 PM. Left patient a message that appointment is for 10/19/22 not 10/12/22. Please call the office if message was heard incorrectly.

## 2022-10-19 ENCOUNTER — Encounter: Payer: Medicaid Other | Admitting: Obstetrics and Gynecology

## 2022-10-20 ENCOUNTER — Other Ambulatory Visit: Payer: Self-pay | Admitting: *Deleted

## 2022-10-20 ENCOUNTER — Ambulatory Visit: Payer: Medicaid Other

## 2022-10-20 ENCOUNTER — Ambulatory Visit: Payer: Medicaid Other | Attending: Obstetrics and Gynecology

## 2022-10-20 VITALS — BP 134/66 | HR 89

## 2022-10-20 DIAGNOSIS — O4443 Low lying placenta NOS or without hemorrhage, third trimester: Secondary | ICD-10-CM | POA: Insufficient documentation

## 2022-10-20 DIAGNOSIS — Z8774 Personal history of (corrected) congenital malformations of heart and circulatory system: Secondary | ICD-10-CM

## 2022-10-20 DIAGNOSIS — O09293 Supervision of pregnancy with other poor reproductive or obstetric history, third trimester: Secondary | ICD-10-CM | POA: Insufficient documentation

## 2022-10-20 DIAGNOSIS — O34219 Maternal care for unspecified type scar from previous cesarean delivery: Secondary | ICD-10-CM | POA: Diagnosis not present

## 2022-10-20 DIAGNOSIS — Z98891 History of uterine scar from previous surgery: Secondary | ICD-10-CM | POA: Insufficient documentation

## 2022-10-20 DIAGNOSIS — Z3480 Encounter for supervision of other normal pregnancy, unspecified trimester: Secondary | ICD-10-CM

## 2022-10-20 DIAGNOSIS — Q210 Ventricular septal defect: Secondary | ICD-10-CM

## 2022-10-20 DIAGNOSIS — O4440 Low lying placenta NOS or without hemorrhage, unspecified trimester: Secondary | ICD-10-CM | POA: Diagnosis present

## 2022-10-20 DIAGNOSIS — O24410 Gestational diabetes mellitus in pregnancy, diet controlled: Secondary | ICD-10-CM | POA: Diagnosis not present

## 2022-10-20 DIAGNOSIS — O24419 Gestational diabetes mellitus in pregnancy, unspecified control: Secondary | ICD-10-CM | POA: Diagnosis present

## 2022-10-20 DIAGNOSIS — O24415 Gestational diabetes mellitus in pregnancy, controlled by oral hypoglycemic drugs: Secondary | ICD-10-CM

## 2022-10-20 DIAGNOSIS — Z3A34 34 weeks gestation of pregnancy: Secondary | ICD-10-CM

## 2022-10-27 ENCOUNTER — Ambulatory Visit: Payer: Medicaid Other

## 2022-11-02 ENCOUNTER — Ambulatory Visit: Payer: Medicaid Other | Admitting: *Deleted

## 2022-11-02 ENCOUNTER — Ambulatory Visit: Payer: Medicaid Other | Attending: Obstetrics

## 2022-11-02 VITALS — BP 134/64 | HR 88

## 2022-11-02 DIAGNOSIS — O09293 Supervision of pregnancy with other poor reproductive or obstetric history, third trimester: Secondary | ICD-10-CM | POA: Diagnosis not present

## 2022-11-02 DIAGNOSIS — O24415 Gestational diabetes mellitus in pregnancy, controlled by oral hypoglycemic drugs: Secondary | ICD-10-CM | POA: Insufficient documentation

## 2022-11-02 DIAGNOSIS — Z3A36 36 weeks gestation of pregnancy: Secondary | ICD-10-CM | POA: Diagnosis not present

## 2022-11-02 DIAGNOSIS — O24419 Gestational diabetes mellitus in pregnancy, unspecified control: Secondary | ICD-10-CM | POA: Insufficient documentation

## 2022-11-02 DIAGNOSIS — O34219 Maternal care for unspecified type scar from previous cesarean delivery: Secondary | ICD-10-CM | POA: Diagnosis not present

## 2022-11-02 DIAGNOSIS — Z8774 Personal history of (corrected) congenital malformations of heart and circulatory system: Secondary | ICD-10-CM

## 2022-11-03 ENCOUNTER — Other Ambulatory Visit (HOSPITAL_COMMUNITY)
Admission: RE | Admit: 2022-11-03 | Discharge: 2022-11-03 | Disposition: A | Discharge status: Home / Self Care | Payer: Medicaid Other | Source: Ambulatory Visit | Attending: Obstetrics and Gynecology | Admitting: Obstetrics and Gynecology

## 2022-11-03 ENCOUNTER — Encounter: Payer: Self-pay | Admitting: Obstetrics and Gynecology

## 2022-11-03 ENCOUNTER — Ambulatory Visit (INDEPENDENT_AMBULATORY_CARE_PROVIDER_SITE_OTHER): Payer: Medicaid Other | Admitting: Obstetrics and Gynecology

## 2022-11-03 VITALS — BP 139/78 | HR 105 | Wt 201.0 lb

## 2022-11-03 DIAGNOSIS — Z3480 Encounter for supervision of other normal pregnancy, unspecified trimester: Secondary | ICD-10-CM

## 2022-11-03 DIAGNOSIS — O4443 Low lying placenta NOS or without hemorrhage, third trimester: Secondary | ICD-10-CM

## 2022-11-03 DIAGNOSIS — O4440 Low lying placenta NOS or without hemorrhage, unspecified trimester: Secondary | ICD-10-CM

## 2022-11-03 DIAGNOSIS — O34211 Maternal care for low transverse scar from previous cesarean delivery: Secondary | ICD-10-CM

## 2022-11-03 DIAGNOSIS — Z98891 History of uterine scar from previous surgery: Secondary | ICD-10-CM

## 2022-11-03 DIAGNOSIS — Z3A36 36 weeks gestation of pregnancy: Secondary | ICD-10-CM

## 2022-11-03 DIAGNOSIS — O24410 Gestational diabetes mellitus in pregnancy, diet controlled: Secondary | ICD-10-CM

## 2022-11-03 LAB — CERVICOVAGINAL ANCILLARY ONLY
Chlamydia: NEGATIVE
Comment: NEGATIVE
Comment: NORMAL
Neisseria Gonorrhea: NEGATIVE

## 2022-11-03 NOTE — Progress Notes (Signed)
   PRENATAL VISIT NOTE  Subjective:  Kayla Bradley is a 32 y.o. [id number] at [redacted]w[redacted]d being seen today for ongoing prenatal care.  She is currently monitored for the following issues for this high-risk pregnancy and has Thoracic scoliosis; Positive ANA (antinuclear antibody); Asymptomatic varicose veins of both lower extremities; H/O: C-section; History of congenital heart defect; History of open heart surgery; Chronic fatigue; Witnessed episode of apnea; Supervision of other normal pregnancy, antepartum; Gestational diabetes; and Low-lying placenta on their problem list.  Patient reports no complaints.  Contractions: Not present. Vag. Bleeding: None.  Movement: Present. Denies leaking of fluid.   The following portions of the patient's history were reviewed and updated as appropriate: allergies, current medications, past family history, past medical history, past social history, past surgical history and problem list.   Objective:   Vitals:   11/03/22 1024  BP: 139/78  Pulse: (!) 105  Weight: 201 lb (91.2 kg)    Fetal Status: Fetal Heart Rate (bpm): 153 Fundal Height: 36 cm Movement: Present     General:  Alert, oriented and cooperative. Patient is in no acute distress.  Skin: Skin is warm and dry. No rash noted.   Cardiovascular: Normal heart rate noted  Respiratory: Normal respiratory effort, no problems with respiration noted  Abdomen: Soft, gravid, appropriate for gestational age.  Pain/Pressure: Present     Pelvic: Cervical exam deferred        Extremities: Normal range of motion.  Edema: None  Mental Status: Normal mood and affect. Normal behavior. Normal judgment and thought content.   Assessment and Plan:  Pregnancy: G3P2002 at [redacted]w[redacted]d 1. Supervision of other normal pregnancy, antepartum Patient is doing well without complaints Cultures today - Cervicovaginal ancillary only( ) - Culture, beta strep (group b only)  2. Medication controlled gestational diabetes  mellitus (GDM) in third trimester Patient started metformin and reports fasting values between 80-90 She reports pp less than 120 Continue with current regimen BPP 8/8 on 1/31. Continue weekly antenatal testing  3. H/O: C-section Scheduled for repeat c-section on 2/19  4. Low-lying placenta Resolved on previous ultrasound  Preterm labor symptoms and general obstetric precautions including but not limited to vaginal bleeding, contractions, leaking of fluid and fetal movement were reviewed in detail with the patient. Please refer to After Visit Summary for other counseling recommendations.   Return in about 1 week (around 11/10/2022) for in person, ROB, High risk.  Future Appointments  Date Time Provider [place]  11/10/2022  2:45 PM [facility]SE [facility]  11/10/2022  3:00 PM [facility] US1 [facility]  11/17/2022  2:15 PM [facility] NURSE [facility]  11/17/2022  2:30 PM [facility] US3 [facility] [place]    Mora Bellman, MD

## 2022-11-07 LAB — CULTURE, BETA STREP (GROUP B ONLY)
MICRO NUMBER:: 14505726
SPECIMEN QUALITY:: ADEQUATE

## 2022-11-08 NOTE — Patient Instructions (Signed)
Kayla Bradley  11/08/2022   Your procedure is scheduled on:  11/21/2022  Arrive at 32 at Entrance C on [address] at [facility]  and [address]. You are invited to use the FREE valet parking or use the Visitor's parking deck.  Pick up the phone at the desk and dial [phone].  Call this number if you have problems the morning of surgery: [phone]  Remember:   Do not eat food:(After Midnight) Desps de medianoche.  Do not drink clear liquids: (After Midnight) Desps de medianoche.  Take these medicines the morning of surgery with A SIP OF WATER:  none   Do not wear jewelry, make-up or nail polish.  Do not wear lotions, powders, or perfumes. Do not wear deodorant.  Do not shave 48 hours prior to surgery.  Do not bring valuables to the hospital.  [facility] is not   responsible for any belongings or valuables brought to the hospital.  Contacts, dentures or bridgework may not be worn into surgery.  Leave suitcase in the car. After surgery it may be brought to your room.  For patients admitted to the hospital, checkout time is 11:00 AM the day of              discharge.      Please read over the following fact sheets that you were given:     Preparing for Surgery

## 2022-11-09 ENCOUNTER — Encounter (HOSPITAL_COMMUNITY): Payer: Self-pay

## 2022-11-09 ENCOUNTER — Telehealth (HOSPITAL_COMMUNITY): Payer: Self-pay | Admitting: *Deleted

## 2022-11-09 NOTE — Telephone Encounter (Signed)
Preadmission screen  

## 2022-11-10 ENCOUNTER — Ambulatory Visit: Payer: Medicaid Other | Attending: Obstetrics

## 2022-11-10 ENCOUNTER — Ambulatory Visit: Payer: Medicaid Other | Admitting: *Deleted

## 2022-11-10 ENCOUNTER — Encounter: Payer: Self-pay | Admitting: *Deleted

## 2022-11-10 ENCOUNTER — Ambulatory Visit (INDEPENDENT_AMBULATORY_CARE_PROVIDER_SITE_OTHER): Payer: Medicaid Other | Admitting: Obstetrics and Gynecology

## 2022-11-10 VITALS — BP 146/82 | HR 97

## 2022-11-10 VITALS — BP 125/67 | HR 90 | Wt 202.0 lb

## 2022-11-10 DIAGNOSIS — O24410 Gestational diabetes mellitus in pregnancy, diet controlled: Secondary | ICD-10-CM

## 2022-11-10 DIAGNOSIS — Q210 Ventricular septal defect: Secondary | ICD-10-CM | POA: Insufficient documentation

## 2022-11-10 DIAGNOSIS — O24415 Gestational diabetes mellitus in pregnancy, controlled by oral hypoglycemic drugs: Secondary | ICD-10-CM

## 2022-11-10 DIAGNOSIS — O09293 Supervision of pregnancy with other poor reproductive or obstetric history, third trimester: Secondary | ICD-10-CM | POA: Diagnosis not present

## 2022-11-10 DIAGNOSIS — Z3A37 37 weeks gestation of pregnancy: Secondary | ICD-10-CM | POA: Insufficient documentation

## 2022-11-10 DIAGNOSIS — Z98891 History of uterine scar from previous surgery: Secondary | ICD-10-CM

## 2022-11-10 DIAGNOSIS — O34219 Maternal care for unspecified type scar from previous cesarean delivery: Secondary | ICD-10-CM | POA: Insufficient documentation

## 2022-11-10 DIAGNOSIS — Z3480 Encounter for supervision of other normal pregnancy, unspecified trimester: Secondary | ICD-10-CM | POA: Diagnosis present

## 2022-11-10 DIAGNOSIS — O4443 Low lying placenta NOS or without hemorrhage, third trimester: Secondary | ICD-10-CM

## 2022-11-10 DIAGNOSIS — O24419 Gestational diabetes mellitus in pregnancy, unspecified control: Secondary | ICD-10-CM | POA: Diagnosis present

## 2022-11-10 DIAGNOSIS — O4440 Low lying placenta NOS or without hemorrhage, unspecified trimester: Secondary | ICD-10-CM

## 2022-11-10 DIAGNOSIS — Z8774 Personal history of (corrected) congenital malformations of heart and circulatory system: Secondary | ICD-10-CM

## 2022-11-10 DIAGNOSIS — Z9889 Other specified postprocedural states: Secondary | ICD-10-CM

## 2022-11-10 MED ORDER — METFORMIN HCL 500 MG PO TABS: 1000.0000 mg | ORAL_TABLET | Freq: Two times a day (BID) | ORAL | 5 refills | Status: DC

## 2022-11-10 NOTE — Patient Instructions (Signed)
Increase to metformin 2 pills twice a day (1000mg  twice a day) You can take 2 extra strength tylenol (1000mg ) as needed for headache up to 3 times per day

## 2022-11-10 NOTE — Progress Notes (Signed)
   PRENATAL VISIT NOTE  Subjective:  Kayla Bradley is a 32 y.o. G3P2002 at [redacted]w[redacted]d being seen today for ongoing prenatal care.  She is currently monitored for the following issues for this high-risk pregnancy and has Thoracic scoliosis; Positive ANA (antinuclear antibody); Asymptomatic varicose veins of both lower extremities; H/O: C-section; History of congenital heart defect; History of open heart surgery; Chronic fatigue; Witnessed episode of apnea; Supervision of other normal pregnancy, antepartum; and Gestational diabetes on their problem list.  Patient reports no complaints.  Contractions: Not present. Vag. Bleeding: None.  Movement: Present. Denies leaking of fluid.   The following portions of the patient's history were reviewed and updated as appropriate: allergies, current medications, past family history, past medical history, past social history, past surgical history and problem list.   Objective:   Vitals:   11/10/22 1031  BP: 125/67  Pulse: 90  Weight: 202 lb (91.6 kg)    Fetal Status: Fetal Heart Rate (bpm): 150   Movement: Present     General:  Alert, oriented and cooperative. Patient is in no acute distress.  Skin: Skin is warm and dry. No rash noted.   Cardiovascular: Normal heart rate noted  Respiratory: Normal respiratory effort, no problems with respiration noted  Abdomen: Soft, gravid, appropriate for gestational age.  Pain/Pressure: Present      Assessment and Plan:  Pregnancy: G3P2002 at [redacted]w[redacted]d 1. Supervision of other normal pregnancy, antepartum 2. [redacted] weeks gestation of pregnancy Repeat CS scheduled 2/19  3. Gestational diabetes mellitus (GDM) in third trimester controlled on oral hypoglycemic drug Reports fasting BG 80-90s and post prandials in low 120s. Log not available for review today. Increase to metformin 1g BID  Last growth 1/18 AGA 74%ile Continue weekly BPPs Plan for CS at 39wk as planned  4. H/O: C-section rCS 2/19  5. History of open  heart surgery 6. History of congenital heart defect Fetal echo WNL, no current cardiac issues  7. Low-lying placenta - RESOLVED Resolved on [place] 1/19. Removed from problem list  Return in about 1 week (around 11/17/2022).  Future Appointments  Date Time Provider [place]  11/10/2022  2:45 PM [facility] NURSE [facility]  11/10/2022  3:00 PM [facility] US1 [facility]  11/17/2022 10:10 AM Radene Gunning, MD [facility]  11/17/2022  2:15 PM [facility] NURSE [facility]  11/17/2022  2:30 PM [facility] US3 [facility]  11/18/2022  9:30 AM [facility] PAT 1 [facility] None  11/29/2022  1:50 PM Renee Harder, CNM [facility]   Inez Catalina, MD

## 2022-11-14 ENCOUNTER — Encounter (HOSPITAL_COMMUNITY): Payer: Self-pay

## 2022-11-14 ENCOUNTER — Telehealth: Payer: Self-pay | Admitting: *Deleted

## 2022-11-14 ENCOUNTER — Telehealth (HOSPITAL_COMMUNITY): Payer: Self-pay | Admitting: *Deleted

## 2022-11-14 NOTE — Telephone Encounter (Signed)
Left patient a message to call Amy Landing with Pre Op Service prior to appointment on 11/18/2022.

## 2022-11-14 NOTE — Telephone Encounter (Signed)
Preadmission screen  

## 2022-11-16 NOTE — Progress Notes (Signed)
   PRENATAL VISIT NOTE  Subjective:  Kayla Bradley is a 32 y.o. G3P2002 at [redacted]w[redacted]d being seen today for ongoing prenatal care.  She is currently monitored for the following issues for this high-risk pregnancy and has Thoracic scoliosis; Positive ANA (antinuclear antibody); Asymptomatic varicose veins of both lower extremities; H/O: C-section; History of congenital heart defect; History of open heart surgery; Chronic fatigue; Witnessed episode of apnea; Supervision of other normal pregnancy, antepartum; and Gestational diabetes on their problem list.  Patient reports {[id number]}.   .  .   . Denies leaking of fluid.   The following portions of the patient's history were reviewed and updated as appropriate: allergies, current medications, past family history, past medical history, past social history, past surgical history and problem list.   Objective:  There were no vitals filed for this visit.  Fetal Status:           General:  Alert, oriented and cooperative. Patient is in no acute distress.  Skin: Skin is warm and dry. No rash noted.   Cardiovascular: Normal heart rate noted  Respiratory: Normal respiratory effort, no problems with respiration noted  Abdomen: Soft, gravid, appropriate for gestational age.        Pelvic: Cervical exam deferred        Extremities: Normal range of motion.     Mental Status: Normal mood and affect. Normal behavior. Normal judgment and thought content.   Assessment and Plan:  Pregnancy: G3P2002 at [redacted]w[redacted]d 1. H/O: C-section Scheduled for repeat c-section on 2/19.   2. Supervision of other normal pregnancy, antepartum Up to date, FH normal.   3. Diet controlled gestational diabetes mellitus (GDM) in third trimester Current regimen: MTF 1000 bid CBG review: *** Regimen changes: none Growth Korea: Has growth [place] after this appointment. Last growth on 1/18 was normal, 74%ile.  Antenatal monitoring: Continue weekly testing Other needs: None   Term labor  symptoms and general obstetric precautions including but not limited to vaginal bleeding, contractions, leaking of fluid and fetal movement were reviewed in detail with the patient. Please refer to After Visit Summary for other counseling recommendations.   No follow-ups on file.  Future Appointments  Date Time Provider [place]  11/17/2022 10:10 AM Radene Gunning, MD [facility]  11/17/2022  2:15 PM [facility] NURSE [facility]  11/17/2022  2:30 PM [facility] US3 [facility]  11/18/2022  9:30 AM [facility] PAT 1 [facility] None  11/29/2022  1:50 PM Renee Harder, CNM [facility]    Radene Gunning, MD

## 2022-11-17 ENCOUNTER — Ambulatory Visit: Payer: Medicaid Other | Admitting: *Deleted

## 2022-11-17 ENCOUNTER — Encounter: Payer: Self-pay | Admitting: Obstetrics and Gynecology

## 2022-11-17 ENCOUNTER — Ambulatory Visit: Payer: Medicaid Other | Attending: Obstetrics

## 2022-11-17 ENCOUNTER — Ambulatory Visit (INDEPENDENT_AMBULATORY_CARE_PROVIDER_SITE_OTHER): Payer: Medicaid Other | Admitting: Obstetrics and Gynecology

## 2022-11-17 ENCOUNTER — Other Ambulatory Visit: Payer: Self-pay | Admitting: Obstetrics

## 2022-11-17 ENCOUNTER — Ambulatory Visit: Payer: Medicaid Other

## 2022-11-17 VITALS — BP 132/75 | HR 90 | Wt 203.0 lb

## 2022-11-17 VITALS — BP 137/62 | HR 88

## 2022-11-17 DIAGNOSIS — Z3A38 38 weeks gestation of pregnancy: Secondary | ICD-10-CM

## 2022-11-17 DIAGNOSIS — Z98891 History of uterine scar from previous surgery: Secondary | ICD-10-CM

## 2022-11-17 DIAGNOSIS — O34219 Maternal care for unspecified type scar from previous cesarean delivery: Secondary | ICD-10-CM | POA: Insufficient documentation

## 2022-11-17 DIAGNOSIS — O24415 Gestational diabetes mellitus in pregnancy, controlled by oral hypoglycemic drugs: Secondary | ICD-10-CM

## 2022-11-17 DIAGNOSIS — O24419 Gestational diabetes mellitus in pregnancy, unspecified control: Secondary | ICD-10-CM | POA: Insufficient documentation

## 2022-11-17 DIAGNOSIS — Z3480 Encounter for supervision of other normal pregnancy, unspecified trimester: Secondary | ICD-10-CM

## 2022-11-17 DIAGNOSIS — Z8632 Personal history of gestational diabetes: Secondary | ICD-10-CM | POA: Diagnosis not present

## 2022-11-17 DIAGNOSIS — O09293 Supervision of pregnancy with other poor reproductive or obstetric history, third trimester: Secondary | ICD-10-CM | POA: Diagnosis not present

## 2022-11-17 DIAGNOSIS — Z8774 Personal history of (corrected) congenital malformations of heart and circulatory system: Secondary | ICD-10-CM

## 2022-11-17 DIAGNOSIS — O283 Abnormal ultrasonic finding on antenatal screening of mother: Secondary | ICD-10-CM | POA: Diagnosis present

## 2022-11-17 DIAGNOSIS — O24410 Gestational diabetes mellitus in pregnancy, diet controlled: Secondary | ICD-10-CM

## 2022-11-17 NOTE — Procedures (Signed)
Kayla Bradley 11-27-1990 [redacted]w[redacted]d Fetus A Non-Stress Test Interpretation for 11/17/22  Indication: Unsatisfactory BPP  Fetal Heart Rate A Mode: External Baseline Rate (A): 130 bpm Variability: Moderate Accelerations: 15 x 15 Decelerations: None Multiple birth?: No  Uterine Activity Mode: Palpation, Toco Contraction Frequency (min): 1 UC Contraction Duration (sec): 80 Contraction Quality: Mild Resting Tone Palpated: Relaxed Resting Time: Adequate  Interpretation (Fetal Testing) Nonstress Test Interpretation: Reactive Overall Impression: Reassuring for gestational age Comments: Tracing reviewed by Dr FAnnamaria Boots

## 2022-11-18 ENCOUNTER — Encounter (HOSPITAL_COMMUNITY)
Admission: RE | Admit: 2022-11-18 | Discharge: 2022-11-18 | Disposition: A | Discharge status: Home / Self Care | Payer: Medicaid Other | Source: Ambulatory Visit | Attending: Family Medicine | Admitting: Family Medicine

## 2022-11-18 DIAGNOSIS — Z01812 Encounter for preprocedural laboratory examination: Secondary | ICD-10-CM | POA: Diagnosis present

## 2022-11-18 DIAGNOSIS — Z98891 History of uterine scar from previous surgery: Secondary | ICD-10-CM | POA: Diagnosis not present

## 2022-11-18 LAB — CBC
HCT: 41.8 % (ref 36.0–46.0)
Hemoglobin: 13.7 g/dL (ref 12.0–15.0)
MCH: 29.7 pg (ref 26.0–34.0)
MCHC: 32.8 g/dL (ref 30.0–36.0)
MCV: 90.7 fL (ref 80.0–100.0)
Platelets: 185 10*3/uL (ref 150–400)
RBC: 4.61 MIL/uL (ref 3.87–5.11)
RDW: 13.5 % (ref 11.5–15.5)
WBC: 7.8 10*3/uL (ref 4.0–10.5)
nRBC: 0 % (ref 0.0–0.2)

## 2022-11-18 LAB — COMPREHENSIVE METABOLIC PANEL
ALT: 12 U/L (ref 0–44)
AST: 17 U/L (ref 15–41)
Albumin: 2.9 g/dL — ABNORMAL LOW (ref 3.5–5.0)
Alkaline Phosphatase: 137 U/L — ABNORMAL HIGH (ref 38–126)
Anion gap: 9 (ref 5–15)
BUN: <5 mg/dL — ABNORMAL LOW (ref 6–20)
CO2: 21 mmol/L — ABNORMAL LOW (ref 22–32)
Calcium: 8.6 mg/dL — ABNORMAL LOW (ref 8.9–10.3)
Chloride: 105 mmol/L (ref 98–111)
Creatinine, Ser: 0.55 mg/dL (ref 0.44–1.00)
GFR, Estimated: >60 mL/min (ref >60)
Glucose, Bld: 82 mg/dL (ref 70–99)
Potassium: 3.9 mmol/L (ref 3.5–5.1)
Sodium: 135 mmol/L (ref 135–145)
Total Bilirubin: 0.2 mg/dL — ABNORMAL LOW (ref 0.3–1.2)
Total Protein: 6.2 g/dL — ABNORMAL LOW (ref 6.5–8.1)

## 2022-11-18 LAB — TYPE AND SCREEN
ABO/RH(D): O POS
Antibody Screen: NEGATIVE

## 2022-11-18 LAB — RPR: RPR Ser Ql: NONREACTIVE

## 2022-11-20 ENCOUNTER — Encounter (HOSPITAL_COMMUNITY): Payer: Self-pay | Admitting: Family Medicine

## 2022-11-21 ENCOUNTER — Inpatient Hospital Stay (HOSPITAL_COMMUNITY)
Admission: AD | Admit: 2022-11-21 | Discharge: 2022-11-24 | DRG: 788 | Disposition: A | Discharge status: Home / Self Care | Payer: Medicaid Other | Attending: Family Medicine | Admitting: Family Medicine

## 2022-11-21 ENCOUNTER — Encounter (HOSPITAL_COMMUNITY): Payer: Self-pay | Admitting: Family Medicine

## 2022-11-21 ENCOUNTER — Other Ambulatory Visit: Payer: Self-pay

## 2022-11-21 ENCOUNTER — Inpatient Hospital Stay (HOSPITAL_COMMUNITY): Payer: Medicaid Other | Admitting: Anesthesiology

## 2022-11-21 ENCOUNTER — Encounter (HOSPITAL_COMMUNITY)
Admission: AD | Disposition: A | Discharge status: Home / Self Care | Payer: Self-pay | Source: Home / Self Care | Attending: Family Medicine

## 2022-11-21 DIAGNOSIS — Z87891 Personal history of nicotine dependence: Secondary | ICD-10-CM

## 2022-11-21 DIAGNOSIS — O24429 Gestational diabetes mellitus in childbirth, unspecified control: Secondary | ICD-10-CM | POA: Diagnosis present

## 2022-11-21 DIAGNOSIS — O24419 Gestational diabetes mellitus in pregnancy, unspecified control: Secondary | ICD-10-CM | POA: Diagnosis present

## 2022-11-21 DIAGNOSIS — R768 Other specified abnormal immunological findings in serum: Secondary | ICD-10-CM | POA: Diagnosis present

## 2022-11-21 DIAGNOSIS — O34211 Maternal care for low transverse scar from previous cesarean delivery: Principal | ICD-10-CM | POA: Diagnosis present

## 2022-11-21 DIAGNOSIS — O874 Varicose veins of lower extremity in the puerperium: Secondary | ICD-10-CM | POA: Diagnosis present

## 2022-11-21 DIAGNOSIS — O2492 Unspecified diabetes mellitus in childbirth: Secondary | ICD-10-CM

## 2022-11-21 DIAGNOSIS — Z3A39 39 weeks gestation of pregnancy: Secondary | ICD-10-CM

## 2022-11-21 DIAGNOSIS — R7689 Other specified abnormal immunological findings in serum: Secondary | ICD-10-CM | POA: Diagnosis present

## 2022-11-21 DIAGNOSIS — Z302 Encounter for sterilization: Secondary | ICD-10-CM | POA: Diagnosis not present

## 2022-11-21 DIAGNOSIS — O9902 Anemia complicating childbirth: Secondary | ICD-10-CM | POA: Diagnosis present

## 2022-11-21 DIAGNOSIS — Z8774 Personal history of (corrected) congenital malformations of heart and circulatory system: Secondary | ICD-10-CM

## 2022-11-21 DIAGNOSIS — Z30430 Encounter for insertion of intrauterine contraceptive device: Secondary | ICD-10-CM | POA: Diagnosis not present

## 2022-11-21 DIAGNOSIS — O24424 Gestational diabetes mellitus in childbirth, insulin controlled: Secondary | ICD-10-CM | POA: Diagnosis not present

## 2022-11-21 DIAGNOSIS — I8393 Asymptomatic varicose veins of bilateral lower extremities: Secondary | ICD-10-CM | POA: Diagnosis present

## 2022-11-21 DIAGNOSIS — Z3480 Encounter for supervision of other normal pregnancy, unspecified trimester: Secondary | ICD-10-CM

## 2022-11-21 DIAGNOSIS — Z98891 History of uterine scar from previous surgery: Principal | ICD-10-CM

## 2022-11-21 LAB — GLUCOSE, CAPILLARY: Glucose-Capillary: 95 mg/dL (ref 70–99)

## 2022-11-21 SURGERY — Surgical Case
Anesthesia: Spinal | Site: Abdomen

## 2022-11-21 MED ORDER — STERILE WATER FOR IRRIGATION IR SOLN
Status: DC | PRN
  Administered 2022-11-21: 1

## 2022-11-21 MED ORDER — DEXAMETHASONE SODIUM PHOSPHATE 10 MG/ML IJ SOLN
INTRAMUSCULAR | Status: DC | PRN
  Administered 2022-11-21: 4 mg via INTRAVENOUS

## 2022-11-21 MED ORDER — KETOROLAC TROMETHAMINE 30 MG/ML IJ SOLN
30.0000 mg | Freq: Once | INTRAMUSCULAR | Status: AC
  Administered 2022-11-21: 30 mg via INTRAVENOUS

## 2022-11-21 MED ORDER — ACETAMINOPHEN 500 MG PO TABS
1000.0000 mg | ORAL_TABLET | Freq: Four times a day (QID) | ORAL | Status: DC
  Administered 2022-11-21 – 2022-11-24 (×12): 1000 mg via ORAL
  Filled 2022-11-21 (×13): qty 2

## 2022-11-21 MED ORDER — SIMETHICONE 80 MG PO CHEW: 80.0000 mg | CHEWABLE_TABLET | ORAL | Status: DC | PRN

## 2022-11-21 MED ORDER — POVIDONE-IODINE 10 % EX SWAB: 2.0000 | Freq: Once | CUTANEOUS | Status: DC

## 2022-11-21 MED ORDER — ENOXAPARIN SODIUM 40 MG/0.4ML IJ SOSY
40.0000 mg | PREFILLED_SYRINGE | INTRAMUSCULAR | Status: DC
  Administered 2022-11-22 – 2022-11-24 (×3): 40 mg via SUBCUTANEOUS
  Filled 2022-11-21 (×4): qty 0.4

## 2022-11-21 MED ORDER — MEPERIDINE HCL 25 MG/ML IJ SOLN: 6.2500 mg | INTRAMUSCULAR | Status: DC | PRN

## 2022-11-21 MED ORDER — OXYCODONE HCL 5 MG PO TABS
5.0000 mg | ORAL_TABLET | ORAL | Status: DC | PRN
  Administered 2022-11-22 (×3): 5 mg via ORAL
  Administered 2022-11-23: 10 mg via ORAL
  Administered 2022-11-23: 5 mg via ORAL
  Administered 2022-11-23 – 2022-11-24 (×3): 10 mg via ORAL
  Administered 2022-11-24: 5 mg via ORAL
  Administered 2022-11-24: 10 mg via ORAL
  Filled 2022-11-21: qty 2
  Filled 2022-11-21: qty 1
  Filled 2022-11-21: qty 2
  Filled 2022-11-21: qty 1
  Filled 2022-11-21 (×3): qty 2
  Filled 2022-11-21 (×2): qty 1
  Filled 2022-11-21: qty 2

## 2022-11-21 MED ORDER — ONDANSETRON HCL 4 MG/2ML IJ SOLN: 4.0000 mg | Freq: Three times a day (TID) | INTRAMUSCULAR | Status: DC | PRN

## 2022-11-21 MED ORDER — PHENYLEPHRINE HCL-NACL 20-0.9 MG/250ML-% IV SOLN
INTRAVENOUS | Status: DC | PRN
  Administered 2022-11-21: 60 ug/min via INTRAVENOUS

## 2022-11-21 MED ORDER — PRENATAL MULTIVITAMIN CH
1.0000 | ORAL_TABLET | Freq: Every day | ORAL | Status: DC
  Administered 2022-11-21 – 2022-11-24 (×4): 1 via ORAL
  Filled 2022-11-21 (×4): qty 1

## 2022-11-21 MED ORDER — OXYTOCIN-SODIUM CHLORIDE 30-0.9 UT/500ML-% IV SOLN
INTRAVENOUS | Status: AC
  Filled 2022-11-21: qty 500

## 2022-11-21 MED ORDER — NALBUPHINE HCL 10 MG/ML IJ SOLN
10.0000 mg | Freq: Once | INTRAMUSCULAR | Status: AC
  Administered 2022-11-21: 10 mg via INTRAVENOUS
  Filled 2022-11-21: qty 1

## 2022-11-21 MED ORDER — LEVONORGESTREL 20 MCG/DAY IU IUD
INTRAUTERINE_SYSTEM | INTRAUTERINE | Status: AC
  Filled 2022-11-21: qty 1

## 2022-11-21 MED ORDER — NALOXONE HCL 4 MG/10ML IJ SOLN: 1.0000 ug/kg/h | INTRAVENOUS | Status: DC | PRN

## 2022-11-21 MED ORDER — IBUPROFEN 600 MG PO TABS
600.0000 mg | ORAL_TABLET | Freq: Four times a day (QID) | ORAL | Status: DC
  Administered 2022-11-22 – 2022-11-24 (×8): 600 mg via ORAL
  Filled 2022-11-21 (×9): qty 1

## 2022-11-21 MED ORDER — LACTATED RINGERS IV SOLN
INTRAVENOUS | Status: DC
  Administered 2022-11-21: 125 mL/h via INTRAVENOUS

## 2022-11-21 MED ORDER — TRANEXAMIC ACID-NACL 1000-0.7 MG/100ML-% IV SOLN
INTRAVENOUS | Status: DC | PRN
  Administered 2022-11-21: 1000 mg via INTRAVENOUS

## 2022-11-21 MED ORDER — DIPHENHYDRAMINE HCL 25 MG PO CAPS: 25.0000 mg | ORAL_CAPSULE | Freq: Four times a day (QID) | ORAL | Status: DC | PRN

## 2022-11-21 MED ORDER — DIPHENHYDRAMINE HCL 50 MG/ML IJ SOLN
INTRAMUSCULAR | Status: AC
  Filled 2022-11-21: qty 1

## 2022-11-21 MED ORDER — SOD CITRATE-CITRIC ACID 500-334 MG/5ML PO SOLN
30.0000 mL | ORAL | Status: AC
  Administered 2022-11-21: 30 mL via ORAL

## 2022-11-21 MED ORDER — ACETAMINOPHEN 10 MG/ML IV SOLN
1000.0000 mg | Freq: Once | INTRAVENOUS | Status: DC | PRN
  Administered 2022-11-21: 1000 mg via INTRAVENOUS

## 2022-11-21 MED ORDER — SCOPOLAMINE 1 MG/3DAYS TD PT72
1.0000 | MEDICATED_PATCH | Freq: Once | TRANSDERMAL | Status: AC
  Administered 2022-11-21: 1.5 mg via TRANSDERMAL

## 2022-11-21 MED ORDER — DIBUCAINE (PERIANAL) 1 % EX OINT: 1.0000 | TOPICAL_OINTMENT | CUTANEOUS | Status: DC | PRN

## 2022-11-21 MED ORDER — DIPHENHYDRAMINE HCL 25 MG PO CAPS: 25.0000 mg | ORAL_CAPSULE | ORAL | Status: DC | PRN

## 2022-11-21 MED ORDER — AMISULPRIDE (ANTIEMETIC) 5 MG/2ML IV SOLN: 10.0000 mg | Freq: Once | INTRAVENOUS | Status: DC | PRN

## 2022-11-21 MED ORDER — SENNOSIDES-DOCUSATE SODIUM 8.6-50 MG PO TABS
2.0000 | ORAL_TABLET | Freq: Every day | ORAL | Status: DC
  Administered 2022-11-22 – 2022-11-24 (×3): 2 via ORAL
  Filled 2022-11-21 (×3): qty 2

## 2022-11-21 MED ORDER — PROMETHAZINE HCL 25 MG/ML IJ SOLN: 6.2500 mg | INTRAMUSCULAR | Status: DC | PRN

## 2022-11-21 MED ORDER — SODIUM CHLORIDE 0.9 % IR SOLN
Status: DC | PRN
  Administered 2022-11-21: 1

## 2022-11-21 MED ORDER — FENTANYL CITRATE (PF) 100 MCG/2ML IJ SOLN
INTRAMUSCULAR | Status: DC | PRN
  Administered 2022-11-21: 15 ug via INTRATHECAL

## 2022-11-21 MED ORDER — LACTATED RINGERS IV SOLN: INTRAVENOUS | Status: DC

## 2022-11-21 MED ORDER — OXYTOCIN-SODIUM CHLORIDE 30-0.9 UT/500ML-% IV SOLN
2.5000 [IU]/h | INTRAVENOUS | Status: AC
  Administered 2022-11-21: 2.5 [IU]/h via INTRAVENOUS
  Filled 2022-11-21: qty 500

## 2022-11-21 MED ORDER — BUPIVACAINE IN DEXTROSE 0.75-8.25 % IT SOLN
INTRATHECAL | Status: DC | PRN
  Administered 2022-11-21: 1.6 mL via INTRATHECAL

## 2022-11-21 MED ORDER — OXYTOCIN-SODIUM CHLORIDE 30-0.9 UT/500ML-% IV SOLN
INTRAVENOUS | Status: DC | PRN
  Administered 2022-11-21: 300 mL via INTRAVENOUS

## 2022-11-21 MED ORDER — DEXAMETHASONE SODIUM PHOSPHATE 4 MG/ML IJ SOLN
INTRAMUSCULAR | Status: AC
  Filled 2022-11-21: qty 1

## 2022-11-21 MED ORDER — CEFAZOLIN SODIUM-DEXTROSE 2-4 GM/100ML-% IV SOLN
INTRAVENOUS | Status: AC
  Filled 2022-11-21: qty 100

## 2022-11-21 MED ORDER — SODIUM CHLORIDE 0.9% FLUSH: 3.0000 mL | INTRAVENOUS | Status: DC | PRN

## 2022-11-21 MED ORDER — PHENYLEPHRINE HCL-NACL 20-0.9 MG/250ML-% IV SOLN
INTRAVENOUS | Status: AC
  Filled 2022-11-21: qty 250

## 2022-11-21 MED ORDER — ONDANSETRON HCL 4 MG/2ML IJ SOLN
INTRAMUSCULAR | Status: DC | PRN
  Administered 2022-11-21: 4 mg via INTRAVENOUS

## 2022-11-21 MED ORDER — OXYCODONE HCL 5 MG PO TABS: 5.0000 mg | ORAL_TABLET | Freq: Once | ORAL | Status: DC | PRN

## 2022-11-21 MED ORDER — COCONUT OIL OIL: 1.0000 | TOPICAL_OIL | Status: DC | PRN

## 2022-11-21 MED ORDER — MENTHOL 3 MG MT LOZG: 1.0000 | LOZENGE | OROMUCOSAL | Status: DC | PRN

## 2022-11-21 MED ORDER — SCOPOLAMINE 1 MG/3DAYS TD PT72
MEDICATED_PATCH | TRANSDERMAL | Status: AC
  Filled 2022-11-21: qty 1

## 2022-11-21 MED ORDER — LEVONORGESTREL 20 MCG/DAY IU IUD
1.0000 | INTRAUTERINE_SYSTEM | Freq: Once | INTRAUTERINE | Status: AC
  Administered 2022-11-21: 1 via INTRAUTERINE

## 2022-11-21 MED ORDER — KETOROLAC TROMETHAMINE 30 MG/ML IJ SOLN
30.0000 mg | Freq: Four times a day (QID) | INTRAMUSCULAR | Status: AC
  Administered 2022-11-21 – 2022-11-22 (×4): 30 mg via INTRAVENOUS
  Filled 2022-11-21 (×4): qty 1

## 2022-11-21 MED ORDER — FENTANYL CITRATE (PF) 100 MCG/2ML IJ SOLN
INTRAMUSCULAR | Status: AC
  Filled 2022-11-21: qty 2

## 2022-11-21 MED ORDER — CEFAZOLIN SODIUM-DEXTROSE 2-4 GM/100ML-% IV SOLN
2.0000 g | INTRAVENOUS | Status: AC
Start: 2022-11-21 — End: 2022-11-21
  Administered 2022-11-21: 2 g via INTRAVENOUS

## 2022-11-21 MED ORDER — FENTANYL CITRATE (PF) 100 MCG/2ML IJ SOLN: 25.0000 ug | INTRAMUSCULAR | Status: DC | PRN

## 2022-11-21 MED ORDER — ZOLPIDEM TARTRATE 5 MG PO TABS: 5.0000 mg | ORAL_TABLET | Freq: Every evening | ORAL | Status: DC | PRN

## 2022-11-21 MED ORDER — TRANEXAMIC ACID-NACL 1000-0.7 MG/100ML-% IV SOLN
INTRAVENOUS | Status: AC
  Filled 2022-11-21: qty 100

## 2022-11-21 MED ORDER — SIMETHICONE 80 MG PO CHEW
80.0000 mg | CHEWABLE_TABLET | Freq: Three times a day (TID) | ORAL | Status: DC
  Administered 2022-11-21 – 2022-11-24 (×8): 80 mg via ORAL
  Filled 2022-11-21 (×9): qty 1

## 2022-11-21 MED ORDER — WITCH HAZEL-GLYCERIN EX PADS: 1.0000 | MEDICATED_PAD | CUTANEOUS | Status: DC | PRN

## 2022-11-21 MED ORDER — ONDANSETRON HCL 4 MG/2ML IJ SOLN
INTRAMUSCULAR | Status: AC
  Filled 2022-11-21: qty 2

## 2022-11-21 MED ORDER — NALOXONE HCL 0.4 MG/ML IJ SOLN: 0.4000 mg | INTRAMUSCULAR | Status: DC | PRN

## 2022-11-21 MED ORDER — MORPHINE SULFATE (PF) 0.5 MG/ML IJ SOLN
INTRAMUSCULAR | Status: AC
  Filled 2022-11-21: qty 10

## 2022-11-21 MED ORDER — KETOROLAC TROMETHAMINE 30 MG/ML IJ SOLN
INTRAMUSCULAR | Status: AC
  Filled 2022-11-21: qty 1

## 2022-11-21 MED ORDER — MORPHINE SULFATE (PF) 0.5 MG/ML IJ SOLN
INTRAMUSCULAR | Status: DC | PRN
  Administered 2022-11-21: 150 ug via INTRATHECAL

## 2022-11-21 MED ORDER — DIPHENHYDRAMINE HCL 50 MG/ML IJ SOLN
12.5000 mg | INTRAMUSCULAR | Status: DC | PRN
  Administered 2022-11-21 – 2022-11-22 (×4): 12.5 mg via INTRAVENOUS
  Filled 2022-11-21 (×2): qty 1

## 2022-11-21 MED ORDER — OXYCODONE HCL 5 MG/5ML PO SOLN: 5.0000 mg | Freq: Once | ORAL | Status: DC | PRN

## 2022-11-21 MED ORDER — ACETAMINOPHEN 10 MG/ML IV SOLN
INTRAVENOUS | Status: AC
  Filled 2022-11-21: qty 100

## 2022-11-21 MED ORDER — SOD CITRATE-CITRIC ACID 500-334 MG/5ML PO SOLN
ORAL | Status: AC
  Filled 2022-11-21: qty 30

## 2022-11-21 SURGICAL SUPPLY — 39 items
APL PRP STRL LF DISP 70% ISPRP (MISCELLANEOUS) ×2
APL SKNCLS STERI-STRIP NONHPOA (GAUZE/BANDAGES/DRESSINGS) ×1
BENZOIN TINCTURE PRP APPL 2/3 (GAUZE/BANDAGES/DRESSINGS) IMPLANT
CHLORAPREP W/TINT 26 (MISCELLANEOUS) ×4 IMPLANT
CLAMP UMBILICAL CORD (MISCELLANEOUS) ×2 IMPLANT
CLOTH BEACON ORANGE TIMEOUT ST (SAFETY) ×2 IMPLANT
DRSG OPSITE POSTOP 4X10 (GAUZE/BANDAGES/DRESSINGS) ×2 IMPLANT
ELECT REM PT RETURN 9FT ADLT (ELECTROSURGICAL) ×1
ELECTRODE REM PT RTRN 9FT ADLT (ELECTROSURGICAL) ×2 IMPLANT
EXTRACTOR VACUUM BELL STYLE (SUCTIONS) IMPLANT
GAUZE PAD ABD 7.5X8 STRL (GAUZE/BANDAGES/DRESSINGS) IMPLANT
GAUZE SPONGE 4X4 12PLY STRL LF (GAUZE/BANDAGES/DRESSINGS) IMPLANT
GLOVE BIOGEL PI IND STRL 7.0 (GLOVE) ×4 IMPLANT
GLOVE ECLIPSE 7.0 STRL STRAW (GLOVE) ×2 IMPLANT
GOWN STRL REUS W/TWL LRG LVL3 (GOWN DISPOSABLE) ×4 IMPLANT
HEMOSTAT ARISTA ABSORB 3G PWDR (HEMOSTASIS) IMPLANT
KIT ABG SYR 3ML LUER SLIP (SYRINGE) ×2 IMPLANT
NDL HYPO 25X5/8 SAFETYGLIDE (NEEDLE) ×2 IMPLANT
NEEDLE HYPO 25X5/8 SAFETYGLIDE (NEEDLE) ×1 IMPLANT
NS IRRIG 1000ML POUR BTL (IV SOLUTION) ×2 IMPLANT
PACK C SECTION WH (CUSTOM PROCEDURE TRAY) ×2 IMPLANT
PAD OB MATERNITY 4.3X12.25 (PERSONAL CARE ITEMS) ×2 IMPLANT
RTRCTR C-SECT PINK 25CM LRG (MISCELLANEOUS) ×2 IMPLANT
STRIP CLOSURE SKIN 1/2X4 (GAUZE/BANDAGES/DRESSINGS) IMPLANT
SUT MNCRL 0 VIOLET CTX 36 (SUTURE) ×4 IMPLANT
SUT MONOCRYL 0 CTX 36 (SUTURE) ×2
SUT PLAIN 0 NONE (SUTURE) IMPLANT
SUT PLAIN 2 0 (SUTURE)
SUT PLAIN 2 0 XLH (SUTURE) IMPLANT
SUT PLAIN ABS 2-0 CT1 27XMFL (SUTURE) IMPLANT
SUT VIC AB 0 CTX 36 (SUTURE) ×1
SUT VIC AB 0 CTX36XBRD ANBCTRL (SUTURE) ×2 IMPLANT
SUT VIC AB 2-0 CT1 27 (SUTURE) ×1
SUT VIC AB 2-0 CT1 TAPERPNT 27 (SUTURE) IMPLANT
SUT VIC AB 4-0 KS 27 (SUTURE) ×2 IMPLANT
TAPE CLOTH SURG 4X10 WHT LF (GAUZE/BANDAGES/DRESSINGS) IMPLANT
TOWEL OR 17X24 6PK STRL BLUE (TOWEL DISPOSABLE) ×2 IMPLANT
TRAY FOLEY W/BAG SLVR 14FR LF (SET/KITS/TRAYS/PACK) IMPLANT
WATER STERILE IRR 1000ML POUR (IV SOLUTION) ×2 IMPLANT

## 2022-11-21 NOTE — Anesthesia Procedure Notes (Signed)
Spinal  Patient location during procedure: OR Start time: 11/21/2022 9:47 AM End time: 11/21/2022 9:52 AM Reason for block: surgical anesthesia Staffing Performed: anesthesiologist  Anesthesiologist: Murvin Natal, MD Performed by: Murvin Natal, MD Authorized by: Murvin Natal, MD   Preanesthetic Checklist Completed: patient identified, IV checked, risks and benefits discussed, surgical consent, monitors and equipment checked, pre-op evaluation and timeout performed Spinal Block Patient position: sitting Prep: DuraPrep Patient monitoring: cardiac monitor, continuous pulse ox and blood pressure Approach: midline Location: L4-5 Injection technique: single-shot Needle Needle type: Pencan  Needle gauge: 24 G Needle length: 9 cm Assessment Sensory level: T10 Events: CSF return Additional Notes Functioning IV was confirmed and monitors were applied. Sterile prep and drape, including hand hygiene and sterile gloves were used. The patient was positioned and the spine was prepped. The skin was anesthetized with lidocaine.  Free flow of clear CSF was obtained prior to injecting local anesthetic into the CSF.  The spinal needle aspirated freely following injection.  The needle was carefully withdrawn.  The patient tolerated the procedure well.

## 2022-11-21 NOTE — Op Note (Cosign Needed)
Operative Note   Patient: Kayla Bradley  Date of Procedure: 11/21/2022  Procedure: Repeat Low Transverse Cesarean and Post placental IUD insertion    Indications: previous uterine incision: low transverse  Pre-operative Diagnosis: history of 2 prior cesarean sections; unwanted fertility.   Post-operative Diagnosis: Same  TOLAC Candidate: No  Surgeon: Surgeon(s) and Role:    * Lew Prout, Annice Needy, MD - Primary    * Caron Presume, Angelyn Punt, MD - Assisting  An experienced assistant was required given the standard of surgical care given the complexity of the case.  This assistant was needed for exposure, dissection, suctioning, retraction, instrument exchange, assisting with delivery with administration of fundal pressure, and for overall help during the procedure.   Anesthesia: spinal  Anesthesiologist: Dr. Roanna Banning   Antibiotics: Cefazolin   Estimated Blood Loss: 533 ml   Total IV Fluids: 3000 ml  Urine Output:  150 cc OF clear urine  Specimens: Placenta    Complications: no complications   Indications: Kayla Bradley is a 32 y.o. [phone] with an IUP [id number] for scheduled cesarean secondary to the indications listed above.   The risks of cesarean section and IUD placement discussed with the patient included but were not limited to: bleeding which may require transfusion or reoperation; infection which may require antibiotics; injury to bowel, bladder, ureters or other surrounding organs; injury to the fetus; need for additional procedures including hysterectomy in the event of a life-threatening hemorrhage; placental abnormalities with subsequent pregnancies, incisional problems, thromboembolic phenomenon and other postoperative/anesthesia complications. The patient concurred with the proposed plan, giving informed written consent for the procedure. Patient has been NPO since last night she will remain NPO for procedure. Anesthesia and OR aware. Preoperative  prophylactic antibiotics and SCDs ordered on call to the OR.   Findings: Viable infant in cephalic presentation, no nuchal cord present. Apgars 8 , 8 , . Weight 4040 g . Clear amniotic fluid. Normal placenta, three vessel cord. Normal uterus, Normal bilateral fallopian tubes, Normal bilateral ovaries.  Procedure Details: A Time Out was held and the above information confirmed. The patient received intravenous antibiotics and had sequential compression devices applied to her lower extremities preoperatively. The patient was taken back to the operative suite where spinal anesthesia was administered. After induction of anesthesia, the patient was draped and prepped in the usual sterile manner and placed in a dorsal supine position with a leftward tilt. A low transverse skin incision was made with scalpel and carried down through the subcutaneous tissue to the fascia. Fascial incision was made and extended transversely. The fascia was separated from the underlying rectus tissue superiorly and inferiorly. The rectus muscles were separated in the midline bluntly and the peritoneum was entered bluntly. An Alexis retractor was placed to aid in visualization of the uterus. A bladder flap was not developed. A low transverse uterine incision was made. The infant was successfully delivered from cephalic presentation, the umbilical cord was clamped after 1 minute. Cord ph was not sent, and cord blood was obtained for evaluation. The placenta was removed Intact and appeared normal.   A Mirena IUD was then removed from it's packaging in a sterile manner and the strings were trimmed to approximately 10 cm. The IUD was placed manually at the uterine fundus and the strings were passed through the cervical os with a Kelly clamp.   The uterine incision was closed with a single layer running unlocked suture of 0-Monocryl. Overall, excellent hemostasis was noted. The abdomen and the pelvis  were cleared of all clot and debris and  the Ubaldo Glassing was removed. Hemostasis was confirmed on all surfaces.  The peritoneum was reapproximated using 2-0 vicryl . The fascia was then closed using 0 Vicryl in a running fashion. The subcutaneous layer was reapproximated with 2-0 plain gut suture. The skin was closed with a 4-0 vicryl subcuticular stitch. The patient tolerated the procedure well. Sponge, lap, instrument and needle counts were correct x 2. She was taken to the recovery room in stable condition.  Disposition: PACU - hemodynamically stable.    Signed: Concepcion Living, MD [facility] Fellow  Center for [place], [place] of Attending Supervision of Obstetric Fellow During Surgery: An experienced assistant was required given the standard of surgical care given the complexity of the case.  This assistant was needed for exposure, dissection, suctioning, retraction, instrument exchange, assisting with delivery with administration of fundal pressure, and for overall help during the procedure. Surgery was performed with the Obstetric Fellow under my supervision and collaboration.  I was present and scrubbed for the entire procedure.   I have reviewed the Obstetric Fellow's operative report, and I agree with the documentation except as noted below.  I have edited the above note for accuracy and clarity  Clarnce Flock, MD/MPH Attending Family Medicine Physician, [facility] for [facility], [place]

## 2022-11-21 NOTE — Anesthesia Postprocedure Evaluation (Signed)
Anesthesia Post Note  Patient: Kayla Bradley  Procedure(s) Performed: CESAREAN SECTION AND IUD INSERT (Abdomen)     Patient location during evaluation: PACU Anesthesia Type: Spinal Level of consciousness: awake Pain management: pain level controlled Vital Signs Assessment: post-procedure vital signs reviewed and stable Respiratory status: spontaneous breathing, nonlabored ventilation and respiratory function stable Cardiovascular status: blood pressure returned to baseline and stable Postop Assessment: no apparent nausea or vomiting Anesthetic complications: no   No notable events documented.  Last Vitals:  Vitals:   11/21/22 1547 11/21/22 1642  BP: (!) 122/56 128/71  Pulse: 77 74  Resp: 18 17  Temp: 37.1 C 36.8 C  SpO2: 97% 95%    Last Pain:  Vitals:   11/21/22 1642  TempSrc: Oral  PainSc: 1    Pain Goal:                   Karyl Kinnier Calloway [place]

## 2022-11-21 NOTE — Anesthesia Preprocedure Evaluation (Addendum)
Anesthesia Evaluation  Patient identified by MRN, date of birth, ID band Patient awake    Reviewed: Allergy & Precautions, NPO status , Patient's Chart, lab work & pertinent test results  Airway Mallampati: III  TM Distance: >3 FB Neck ROM: Full    Dental no notable dental hx.    Pulmonary former smoker   Pulmonary exam normal        Cardiovascular Normal cardiovascular exam  H/o VSD, s/p repair. Normal ECHO in 2021 per patient. Follows with cardiology prn.    Neuro/Psych negative neurological ROS  negative psych ROS   GI/Hepatic negative GI ROS, Neg liver ROS,,,  Endo/Other  diabetes, Oral Hypoglycemic Agents    Renal/GU negative Renal ROS     Musculoskeletal negative musculoskeletal ROS (+)    Abdominal   Peds  Hematology negative hematology ROS (+)   Anesthesia Other Findings Repeat C-S  Encounter for Contraception    Reproductive/Obstetrics                             Anesthesia Physical Anesthesia Plan  ASA: 2  Anesthesia Plan: Spinal   Post-op Pain Management:    Induction:   PONV Risk Score and Plan: 2 and Ondansetron, Dexamethasone and Treatment may vary due to age or medical condition  Airway Management Planned: Natural Airway  Additional Equipment:   Intra-op Plan:   Post-operative Plan:   Informed Consent: I have reviewed the patients History and Physical, chart, labs and discussed the procedure including the risks, benefits and alternatives for the proposed anesthesia with the patient or authorized representative who has indicated his/her understanding and acceptance.     Dental advisory given  Plan Discussed with: CRNA  Anesthesia Plan Comments:        Anesthesia Quick Evaluation

## 2022-11-21 NOTE — Transfer of Care (Signed)
Immediate Anesthesia Transfer of Care Note  Patient: Kayla Bradley  Procedure(s) Performed: CESAREAN SECTION AND IUD INSERT (Abdomen)  Patient Location: PACU  Anesthesia Type:Spinal  Level of Consciousness: awake  Airway & Oxygen Therapy: Patient Spontanous Breathing  Post-op Assessment: Report given to RN and Post -op Vital signs reviewed and stable  Post vital signs: Reviewed and stable  Last Vitals:  Vitals Value Taken Time  BP 119/76 11/21/22 1115  Temp    Pulse 85 11/21/22 1117  Resp 15 11/21/22 1117  SpO2 98 % 11/21/22 1117  Vitals shown include unvalidated device data.  Last Pain:  Vitals:   11/21/22 0814  TempSrc: Oral         Complications: No notable events documented.

## 2022-11-21 NOTE — Progress Notes (Signed)
Circumcision Consent  Discussed with mom at bedside about circumcision.   Circumcision is a surgery that removes the skin that covers the tip of the penis, called the "foreskin." Circumcision is usually done when a boy is between 70 and 63 days old, sometimes up to 20-43 weeks old.  The most common reasons boys are circumcised include for cultural/religious beliefs or for parental preference (potentially easier to clean, so baby looks like daddy, etc).  There may be some medical benefits for circumcision:   Circumcised boys seem to have slightly lower rates of: ? Urinary tract infections (per the American Academy of Pediatrics an uncircumcised boy has a 1/100 chance of developing a UTI in the first year of life, a circumcised boy at a 10/998 chance of developing a UTI in the first year of life- a 10% reduction) ? Penis cancer (typically rare- an uncircumcised female has a 1 in 100,000 chance of developing cancer of the penis) ? Sexually transmitted infection (in endemic areas, including HIV, HPV and Herpes- circumcision does NOT protect against gonorrhea, chlamydia, trachomatis, or syphilis) ? Phimosis: a condition where that makes retraction of the foreskin over the glans impossible (0.4 per 1000 boys per year or 0.6% of boys are affected by their 15th birthday)  Boys and men who are not circumcised can reduce these extra risks by: ? Cleaning their penis well ? Using condoms during sex  What are the risks of circumcision?  As with any surgical procedure, there are risks and complications. In circumcision, complications are rare and usually minor, the most common being: ? Bleeding- risk is reduced by holding each clamp for 30 seconds prior to a cut being made, and by holding pressure after the procedure is done ? Infection- the penis is cleaned prior to the procedure, and the procedure is done under sterile technique ? Damage to the urethra or amputation of the penis  How is circumcision done  in baby boys?  The baby will be placed on a special table and the legs restrained for their safety. Numbing medication is injected into the penis, and the skin is cleansed with betadine to decrease the risk of infection.   What to expect:  The penis will look red and raw for 5-7 days as it heals. We expect scabbing around where the cut was made, as well as clear-pink fluid and some swelling of the penis right after the procedure. If your baby's circumcision starts to bleed or develops pus, please contact your pediatrician immediately.  All questions were answered and mother consented.  Concepcion Living, MD 1:12 PM

## 2022-11-21 NOTE — Discharge Summary (Signed)
Postpartum Discharge Summary  Date of Service updated 11/24/22      Patient Name: Kayla Bradley DOB: 1990-10-21 MRN: [id number]  Date of admission: 11/21/2022 Delivery date:11/21/2022  Delivering provider: Clarnce Flock  Date of discharge: 11/24/2022  Admitting diagnosis: History of cesarean delivery [Z98.891] Cesarean delivery delivered [O82] Intrauterine pregnancy: [redacted]w[redacted]d    Secondary diagnosis:  Active Problems:   Positive ANA (antinuclear antibody)   Asymptomatic varicose veins of both lower extremities   H/O: C-section   History of congenital heart defect   Supervision of other normal pregnancy, antepartum   Gestational diabetes   Cesarean delivery delivered  Additional problems: n/a     Discharge diagnosis: Term Pregnancy Delivered and GDM A2                                              Post partum procedures: n/a Augmentation: N/A Complications: None  Hospital course: Sceduled C/S   32y.o. yo G3P3003 at 371w1das admitted to the hospital 11/21/2022 for scheduled cesarean section with the following indication:Elective Repeat.Delivery details are as follows:  Membrane Rupture Time/Date: 10:15 AM ,11/21/2022   Delivery Method:C-Section, Low Transverse  Details of operation can be found in separate operative note.  Patient had a postpartum course uncomplicated.  She is ambulating, tolerating a regular diet, passing flatus, and urinating well. Patient is discharged home in stable condition on  11/24/22        Newborn Data: Birth date:11/21/2022  Birth time:10:15 AM  Gender:Female  Living status:Living  Apgars:8 ,8  Weight:4040 g     Magnesium Sulfate received: No BMZ received: No Rhophylac:N/A MMR:N/A T-DaP:Given prenatally Flu: No Transfusion:No  Physical exam  Vitals:   11/23/22 1340 11/23/22 2002 11/24/22 0149 11/24/22 0800  BP: 127/74 118/81 121/62 122/72  Pulse: 77 66 63 65  Resp: '16 17 16 17  '$ Temp: 98.5 F (36.9 C) 97.8 F (36.6 C) 98.2  F (36.8 C) 98 F (36.7 C)  TempSrc:  Oral Oral Oral  SpO2:  100% 97% 99%  Weight:      Height:       General: alert, cooperative, and no distress Lochia: appropriate Uterine Fundus: firm Incision: Dressing is clean, dry, and intact DVT Evaluation: No evidence of DVT seen on physical exam. Labs: Lab Results  Component Value Date   WBC 9.6 11/22/2022   HGB 10.5 (L) 11/22/2022   HCT 31.8 (L) 11/22/2022   MCV 90.6 11/22/2022   PLT 151 11/22/2022      Latest Ref Rng & Units 11/18/2022    9:42 AM  CMP  Glucose 70 - 99 mg/dL 82   BUN 6 - 20 mg/dL <5   Creatinine 0.44 - 1.00 mg/dL 0.55   Sodium 135 - 145 mmol/L 135   Potassium 3.5 - 5.1 mmol/L 3.9   Chloride 98 - 111 mmol/L 105   CO2 22 - 32 mmol/L 21   Calcium 8.9 - 10.3 mg/dL 8.6   Total Protein 6.5 - 8.1 g/dL 6.2   Total Bilirubin 0.3 - 1.2 mg/dL 0.2   Alkaline Phos 38 - 126 U/L 137   AST 15 - 41 U/L 17   ALT 0 - 44 U/L 12    Edinburgh Score:    11/21/2022   12:38 PM  Edinburgh Postnatal Depression Scale Screening Tool  I have been able to  laugh and see the funny side of things. 0  I have looked forward with enjoyment to things. 0  I have blamed myself unnecessarily when things went wrong. 0  I have been anxious or worried for no good reason. 0  I have felt scared or panicky for no good reason. 0  Things have been getting on top of me. 0  I have been so unhappy that I have had difficulty sleeping. 0  I have felt sad or miserable. 0  I have been so unhappy that I have been crying. 0  The thought of harming myself has occurred to me. 0  Edinburgh Postnatal Depression Scale Total 0     After visit meds:  Allergies as of 11/24/2022       Reactions   Escitalopram Other (See Comments)   Made patient feel crazy        Medication List     STOP taking these medications    Accu-Chek Guide test strip Generic drug: glucose blood   Accu-Chek Guide w/Device Kit   Accu-Chek Softclix Lancets lancets   aspirin  EC 81 MG tablet   Comfort Fit Maternity Supp Med Misc   metFORMIN 500 MG tablet Commonly known as: GLUCOPHAGE       TAKE these medications    acetaminophen 500 MG tablet Commonly known as: TYLENOL Take 500 mg by mouth every 6 (six) hours as needed for headache.   Blood Pressure Kit Devi 1 kit by Does not apply route once a week.   ibuprofen 600 MG tablet Commonly known as: ADVIL Take 1 tablet (600 mg total) by mouth every 6 (six) hours.   oxyCODONE 5 MG immediate release tablet Commonly known as: Oxy IR/ROXICODONE Take 1-2 tablets (5-10 mg total) by mouth every 4 (four) hours as needed for moderate pain.   prenatal vitamin w/FE, FA 27-1 MG Tabs tablet Take 1 tablet by mouth daily at 12 noon.   senna-docusate 8.6-50 MG tablet Commonly known as: Senokot-S Take 2 tablets by mouth daily.   simethicone 80 MG chewable tablet Commonly known as: MYLICON Chew 1 tablet (80 mg total) by mouth 3 (three) times daily after meals.         Discharge home in stable condition Infant Feeding: Bottle and Breast Infant Disposition:NICU Discharge instruction: per After Visit Summary and Postpartum booklet. Activity: Advance as tolerated. Pelvic rest for 6 weeks.  Diet: routine diet Future Appointments: Future Appointments  Date Time Provider [place]  11/29/2022  1:50 PM Renee Harder, CNM [facility]   Follow up Visit: Message sent    Please schedule this patient for a In person postpartum visit in 6 weeks with the following provider: Any provider. Additional Postpartum F/U:2 hour GTT  High risk pregnancy complicated by: GDM Delivery mode:  C-Section, Low Transverse  Anticipated Birth Control:  PP IUD placed   11/24/2022 Jacquiline Doe, CNM

## 2022-11-21 NOTE — H&P (Addendum)
Faculty Practice H&P  Kayla Bradley is a 32 y.o. female [phone] with IUP at 16w1dpresenting for repeat cesarean with IUD placement vs bilateral salpingectomy. Pregnancy was been complicated by GDMA2.    Pt states she has been having some contractions, no vaginal bleeding, intact membranes, with normal fetal movement.     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  She plans to breastfeed  Prenatal labs and studies: ABO, Rh: --/--/O POS (02/16 0945) Antibody: NEG (02/16 0945) Rubella: 2.21 (08/24 1155) RPR: NON REACTIVE (02/16 0942)  HBsAg: NON-REACTIVE (08/24 1155)  HIV: NON-REACTIVE (12/07 0946)  GBS:    2hr Glucola: positive Genetic screening: normal Anatomy [place] normal  Past Medical History:  Past Medical History:  Diagnosis Date   Dental infection    Dyspnea    Gestational diabetes    Postpartum hemorrhage    VSD (ventricular septal defect)    repaired by surgery at 32yrold    Past Surgical History:  Past Surgical History:  Procedure Laterality Date   CESAREAN SECTION     CESAREAN SECTION Bilateral 05/30/2021   Procedure: CESAREAN SECTION;  Surgeon: DuRadene GunningMD;  Location: [facility];  Service: Obstetrics;  Laterality: Bilateral;   heart defect fixed     VSD REPAIR      Obstetrical History:  OB History     Gravida  3   Para  2   Term  2   Preterm      AB       Living  2      SAB      IAB      Ectopic      Multiple  0   Live Births  2           Gynecological History:  OB History     Gravida  3   Para  2   Term  2   Preterm      AB      Living  2      SAB      IAB      Ectopic      Multiple  0   Live Births  2           Social History:  Social History   Socioeconomic History   Marital status: Married    Spouse name: Not on file   Number of children: 1   Years of education: Not on file   Highest education level: Not on file  Occupational History   Occupation: Host    Comment: [employer], research (life sciences) Occupation: UNEMPLOYED  Tobacco Use   Smoking status: Former    Types: Cigarettes   Smokeless tobacco: Never  VaScientific laboratory technicianse: Never used  Substance and Sexual Activity   Alcohol use: Not Currently    Alcohol/week: 1.0 standard drink of alcohol    Types: 1 Cans of beer  per week    Comment: rarely   Drug use: Not Currently    Frequency: 7.0 times per week    Types: Marijuana    Comment: 2 years ago   Sexual activity: Yes    Partners: Male  Other Topics Concern   Not on file  Social History Narrative   Not on file   Social Determinants of Health   Financial Resource Strain: Not on file  Food Insecurity: Not on file  Transportation Needs: Not on file  Physical Activity: Not on file  Stress: Not on file  Social Connections: Not on file    Family History:  Family History  Problem Relation Age of Onset   Diabetes Father    Diabetes Sister    Heart disease Maternal Grandfather    Asthma Neg Hx    Cancer Neg Hx    Hypertension Neg Hx    Stroke Neg Hx     Medications:  Prenatal vitamins,  No current facility-administered medications for this encounter.    Allergies:  Allergies  Allergen Reactions   Escitalopram Other (See Comments)    Made patient feel crazy    Review of Systems: -  Neg  Physical Exam: Last menstrual period 02/20/2022, not currently  breastfeeding. GENERAL: Well-developed, well-nourished female in no acute distress.  LUNGS: Normal work of breathing HEART: Regular rate ABDOMEN: Soft, nontender, nondistended, gravid.  EXTREMITIES: Nontender, no edema, 2+ distal pulses. Presentation: cephalic    Pertinent Labs/Studies:   Lab Results  Component Value Date   WBC 7.8 11/18/2022   HGB 13.7 11/18/2022   HCT 41.8 11/18/2022   MCV 90.7 11/18/2022   PLT 185 11/18/2022    Assessment : Kayla Bradley is a 32 y.o. G3P2002 at 63w1dbeing admitted for cesarean section secondary to repeat CS  Plan: The risks of cesarean section discussed with the patient included but were not limited to: bleeding which may require transfusion or reoperation; infection which may require antibiotics; injury to bowel, bladder, ureters or other surrounding organs; injury to the fetus; need for additional procedures including hysterectomy in the event of a life-threatening hemorrhage; placental abnormalities wth subsequent pregnancies, incisional problems, thromboembolic phenomenon and other postoperative/anesthesia complications. The patient concurred with the proposed plan, giving informed written consent for the procedure.   Patient has been NPO since midnight and will remain NPO for procedure.  Preoperative prophylactic Ancef ordered on call to the OR.    CConcepcion Living MD 11/21/2022, 7:55 AM

## 2022-11-22 LAB — CBC
HCT: 31.8 % — ABNORMAL LOW (ref 36.0–46.0)
Hemoglobin: 10.5 g/dL — ABNORMAL LOW (ref 12.0–15.0)
MCH: 29.9 pg (ref 26.0–34.0)
MCHC: 33 g/dL (ref 30.0–36.0)
MCV: 90.6 fL (ref 80.0–100.0)
Platelets: 151 10*3/uL (ref 150–400)
RBC: 3.51 MIL/uL — ABNORMAL LOW (ref 3.87–5.11)
RDW: 13.4 % (ref 11.5–15.5)
WBC: 9.6 10*3/uL (ref 4.0–10.5)
nRBC: 0 % (ref 0.0–0.2)

## 2022-11-22 LAB — GLUCOSE, CAPILLARY: Glucose-Capillary: 99 mg/dL (ref 70–99)

## 2022-11-22 MED ORDER — NALBUPHINE HCL 10 MG/ML IJ SOLN: 5.0000 mg | INTRAMUSCULAR | Status: DC | PRN

## 2022-11-22 MED ORDER — NALBUPHINE HCL 10 MG/ML IJ SOLN
5.0000 mg | Freq: Once | INTRAMUSCULAR | Status: AC
  Administered 2022-11-22: 5 mg via INTRAVENOUS
  Filled 2022-11-22: qty 1

## 2022-11-22 NOTE — Progress Notes (Addendum)
POSTOPERATIVE PROGRESS NOTE  POD #1  Subjective:  Kayla Bradley is a 32 y.o. [phone] s/p rLTCS at [redacted]w[redacted]d  She reports she doing well. No acute events overnight. She reports she is doing well. She denies any problems with ambulating, voiding or po intake. Denies nausea or vomiting, but she reports decreased appetite. She has not passed flatus. Pain is well controlled.  Lochia is less than menses.  Objective: Blood pressure 120/65, pulse 68, temperature 98.1 F (36.7 C), temperature source Oral, resp. rate 16, height 5' 3"$  (1.6 m), weight 90.7 kg, last menstrual period 02/20/2022, SpO2 100 %, unknown if currently breastfeeding.  Physical Exam:  General: alert, cooperative and no distress Chest: no respiratory distress Heart:regular rate, distal pulses intact Abdomen: soft, nontender,  Uterine Fundus: firm, appropriately tender DVT Evaluation: No calf swelling or tenderness Extremities: no edema Skin: warm, dry; incision clean/dry/intact w/ dressing in place  Recent Labs    11/22/22 0552  HGB 10.5*  HCT 31.8*    Assessment/Plan: KTEIRRA BEHNKENis a 32y.o. G3P3003 s/p rLTCS at 344w1dPregnancy complicated by GDMA2.  POD#1 - Doing well; pain controlled. H/H appropriate  Routine postpartum care  OOB, ambulated  Lovenox for VTE prophylaxis  Added nubain for itching not controlled on benadryl Anemia: asymptomatic, ferrous sulfate GDMA2: CBG wnl Contraception: ppIUD placed Feeding: breast/formula but considering formula only  Dispo: Plan for discharge tomorrow.   LOS: 1 day   [name] PGY-1 11/22/2022, 7:41 AM  ___ GME ATTESTATION:  Evaluation and management procedures were performed by the [facility] Res[facility] under my supervision. I was immediately available for direct supervision, assistance and direction throughout this encounter.  I also confirm that I have verified the information documented in the resident's note, and that I have also personally  reperformed the pertinent components of the physical exam and all of the medical decision making activities.  I have also made any necessary editorial changes.  JeShelda PalDO OB Fellow, FaK[facility] [place]/20/2024 7:58 AM

## 2022-11-23 NOTE — Lactation Note (Signed)
This note was copied from a baby's chart.  NICU Lactation Consultation Note  Patient Name: Boy Allsion Mcelvany [id number] Date: 11/23/2022 Age:32 years   Subjective Reason for consult: Initial assessment; NICU baby; Term  LC in to visit with P3 Mom of term baby that was transferred to NICU for poor feedings.  Baby is feeding much better, NG tube feeding and now took a full feeding by mouth with SLP.  Earling asked Mom if she was planning to breastfeed.  Mom responded she had thought about it and with everything going on, she wasn't sure until now.  Mom is feeling better this am.  LC assisted with first pumping using 21 mm flanges and a hand's free pumping band.  Plan recommended- 1- STS with baby as much as possible 2-Pump both breasts every 2-3 hrs when awake 3- Offer the breast with feeding cues, and ask for help prn.  Objective Infant data: Mother's Current Feeding Choice: Breast Milk and Formula  Infant feeding assessment Scale for Readiness: 2 Scale for Quality: 3     Maternal data: G3P3003  C-Section, Low Transverse No data recorded Current breast feeding challenges:: Infant a poor feeder and transferred to NICU  No data recorded Does the patient have breastfeeding experience prior to this delivery?: Yes How long did the patient breastfeed?: 6 months  Pumping frequency: First pumping at 50 hrs pp, encouraged to pump every 2-3 hrs Flange Size: 21  No data recorded  Pump: Hands Free (submitted a STORK pump request)  Assessment Infant:  Maternal:  Intervention/Plan Interventions: Breast feeding basics reviewed; Skin to skin; Breast massage; Hand express; DEBP; Education; [employer] brochure  Tools: Pump; Flanges; Bottle; Hands-free pumping top Pump Education: Setup, frequency, and cleaning; Milk Storage  Plan: Consult Status: NICU follow-up  NICU Follow-up type: New admission follow up    Broadus John 11/23/2022, 12:34 PM

## 2022-11-23 NOTE — Progress Notes (Signed)
Patient screened out for psychosocial assessment since none of the following apply:  Psychosocial stressors documented in mother or baby's chart  Gestation less than 32 weeks  Code at delivery   Infant with anomalies Please contact the Clinical Social Worker if specific needs arise, by MOB's request, or if MOB scores greater than 9/yes to question 10 on Edinburgh Postpartum Depression Screen.  Kaleeya Hancock, LCSW Clinical Social Worker [facility] Cell#: [phone]     

## 2022-11-23 NOTE — Progress Notes (Addendum)
POSTPARTUM PROGRESS NOTE  POD #2  Subjective:  Kayla Bradley is a 32 y.o. [phone] s/p rLTCS at [redacted]w[redacted]d  She reports she doing well. No acute events overnight. She was moved to NICU Couplet Care overnight due to her baby's low blood sugars. She complains of back pain that woke her up from sleep overnight. She denies any problems with ambulating, voiding or po intake. Denies nausea or vomiting. She has passed flatus. Pain is well controlled.  Lochia is less than menses.  Objective: Blood pressure 128/65, pulse 74, temperature 98.6 F (37 C), temperature source Oral, resp. rate 16, height 5' 3"$  (1.6 m), weight 90.7 kg, last menstrual period 02/20/2022, SpO2 99 %, unknown if currently breastfeeding.  Physical Exam:  General: alert, cooperative and no distress Chest: no respiratory distress Heart:regular rate, distal pulses intact Abdomen: soft, nontender,  Uterine Fundus: firm, appropriately tender DVT Evaluation: No calf swelling or tenderness Extremities: bo edema Skin: warm, dry; incision clean/dry/intact w/  dressing in place  Recent Labs    11/22/22 0552  HGB 10.5*  HCT 31.8*    Assessment/Plan: KBRITANNY BRISTOWis a 32y.o. G3P3003 s/p rLTCS at 352w1d POD#2 - Doing well; complains of back pain improved with medication. H/H appropriate  Routine postpartum care  OOB, ambulated  Lovenox for VTE prophylaxis Anemia: asymptomatic GDMA2: fasting CBG yesterday within goal range Contraception: ppIUD Feeding: breast, formula; considering formula only  Dispo: Plan for discharge tomorrow.   LOS: 2 days   LyDarlen RoundD PGY-1 11/23/2022, 8:50 AM   Attestation of Supervision of Student:  I confirm that I have verified the information documented in the resident's note and that I have also personally rounded on the patient, re-performed the physical exam and reviewed all medical decision making activities.  I have verified that all services and findings are accurately  documented in this student's note; and I agree with management and plan as outlined in the documentation. I have also made any necessary editorial changes.  --Patient endorses scant flatus, plans to take prescribed Simethicone and move more today --Abdominal dressing C/D/I --Discharge home tomorrow  SaDarlina RumpfCNSouth Plainfieldor WoDean Foods CompanyCoLeitersburgroup 11/23/2022 9:00 AM

## 2022-11-24 MED ORDER — SIMETHICONE 80 MG PO CHEW: 80.0000 mg | CHEWABLE_TABLET | Freq: Three times a day (TID) | ORAL | 0 refills | Status: DC

## 2022-11-24 MED ORDER — SENNOSIDES-DOCUSATE SODIUM 8.6-50 MG PO TABS: 2.0000 | ORAL_TABLET | Freq: Every day | ORAL | 2 refills | Status: DC

## 2022-11-24 MED ORDER — IBUPROFEN 600 MG PO TABS: 600.0000 mg | ORAL_TABLET | Freq: Four times a day (QID) | ORAL | 0 refills | Status: DC

## 2022-11-24 MED ORDER — OXYCODONE HCL 5 MG PO TABS: 5.0000 mg | ORAL_TABLET | ORAL | 0 refills | Status: DC | PRN

## 2022-11-24 NOTE — Progress Notes (Signed)
Patient ID: Kayla Bradley, female   DOB: 03-16-1991, 32 y.o.   MRN: [id number]  Kayla Bradley [id number] Postpartum Day 3 S/P Repeat Cesarean Section due to Elective  Subjective: Patient up ad lib, denies syncope or dizziness. Reports consuming regular diet without issues and denies N/V. Patient reports no bowel movement, but passing flatus although scant.  Denies issues with urination and reports normal bleeding.  Patient is breastfeeding and reports going well.  ppIUD placed for postpartum contraception.  Pain is being appropriately managed with use of oxycodone, tylenol, and ibuprofen. Patient reports she is scheduled for an incision check on Tuesday.  Objective: Temp:  [97.8 F (36.6 C)-98.5 F (36.9 C)] 98 F (36.7 C) (02/22 0800) Pulse Rate:  [63-77] 65 (02/22 0800) Resp:  [16-17] 17 (02/22 0800) BP: (118-127)/(62-81) 122/72 (02/22 0800) SpO2:  [97 %-100 %] 99 % (02/22 0800)  Recent Labs    11/22/22 0552  HGB 10.5*  HCT 31.8*  WBC 9.6    Physical Exam:  General: alert, cooperative, and no distress Mood/Affect: Appropriate/Appropriate Lungs: clear to auscultation, no wheezes, rales or rhonchi, symmetric air entry.  Heart: normal rate and regular rhythm. Breast: not examined. Abdomen:  + bowel sounds, Soft, Appropriately Tender Incision: No significant drainage on Honeycomb dressing  Uterine Fundus: firm Lochia: Not assessed Skin: Warm, Dry. DVT Evaluation: No evidence of DVT seen on physical exam. No significant calf/ankle edema. JP drain:   None  Assessment Post Operative Day 3 S/P Repeat C/S Normal Involution BreastFeeding Hemodynamically Stable  Plan: -Discussed normal CBG. Plan for repeat GTT during PPV.  -Postpartum care and planning discussed.  -Continue other mgmt as ordered. -L&D team be updated on patient status. -Plan for discharge today.   Maryann Conners MSN, CNM 11/24/2022, 8:54 AM

## 2022-11-24 NOTE — Plan of Care (Signed)
  Problem: Education: Goal: Knowledge of General Education information will improve Description: Including pain rating scale, medication(s)/side effects and non-pharmacologic comfort measures Outcome: Adequate for Discharge   Problem: Health Behavior/Discharge Planning: Goal: Ability to manage health-related needs will improve Outcome: Adequate for Discharge   Problem: Clinical Measurements: Goal: Ability to maintain clinical measurements within normal limits will improve Outcome: Adequate for Discharge Goal: Will remain free from infection Outcome: Adequate for Discharge Goal: Diagnostic test results will improve Outcome: Adequate for Discharge Goal: Respiratory complications will improve Outcome: Adequate for Discharge Goal: Cardiovascular complication will be avoided Outcome: Adequate for Discharge   Problem: Activity: Goal: Risk for activity intolerance will decrease Outcome: Adequate for Discharge   Problem: Nutrition: Goal: Adequate nutrition will be maintained Outcome: Adequate for Discharge   Problem: Coping: Goal: Level of anxiety will decrease Outcome: Adequate for Discharge   Problem: Elimination: Goal: Will not experience complications related to bowel motility Outcome: Adequate for Discharge Goal: Will not experience complications related to urinary retention Outcome: Adequate for Discharge   Problem: Pain Managment: Goal: General experience of comfort will improve Outcome: Adequate for Discharge   Problem: Safety: Goal: Ability to remain free from injury will improve Outcome: Adequate for Discharge   Problem: Skin Integrity: Goal: Risk for impaired skin integrity will decrease Outcome: Adequate for Discharge   Problem: Education: Goal: Knowledge of the prescribed therapeutic regimen will improve Outcome: Adequate for Discharge Goal: Understanding of sexual limitations or changes related to disease process or condition will improve Outcome: Adequate  for Discharge Goal: Individualized Educational Video(s) Outcome: Adequate for Discharge   Problem: Self-Concept: Goal: Communication of feelings regarding changes in body function or appearance will improve Outcome: Adequate for Discharge   Problem: Skin Integrity: Goal: Demonstration of wound healing without infection will improve Outcome: Adequate for Discharge   Problem: Education: Goal: Knowledge of condition will improve Outcome: Adequate for Discharge Goal: Individualized Educational Video(s) Outcome: Adequate for Discharge Goal: Individualized Newborn Educational Video(s) Outcome: Adequate for Discharge   Problem: Activity: Goal: Will verbalize the importance of balancing activity with adequate rest periods Outcome: Adequate for Discharge Goal: Ability to tolerate increased activity will improve Outcome: Adequate for Discharge   Problem: Coping: Goal: Ability to identify and utilize available resources and services will improve Outcome: Adequate for Discharge   Problem: Life Cycle: Goal: Chance of risk for complications during the postpartum period will decrease Outcome: Adequate for Discharge   Problem: Role Relationship: Goal: Ability to demonstrate positive interaction with newborn will improve Outcome: Adequate for Discharge   Problem: Skin Integrity: Goal: Demonstration of wound healing without infection will improve Outcome: Adequate for Discharge

## 2022-11-24 NOTE — Lactation Note (Signed)
This note was copied from a baby's chart.  NICU Lactation Consultation Note  Patient Name: Kayla Bradley [id number] Date: 11/24/2022 Age:32 years  Subjective Reason for consult: Follow-up assessment; NICU baby; Term; Maternal discharge; Maternal endocrine disorder  Visited with family of 31 hours old FT NICU female; Kayla Bradley is a P3 (32) and reports she's pumping but not consistently. Explained the importance of consistent pumping for the onset of lactogenesis II and the prevention of engorgement, her plan is to do both, breast and formula feeding. Kayla Bradley is getting discharge today and possibly baby as well. Reviewed discharge education, engorgement prevention/treatment, lactogenesis II, pumping schedule, pump settings and anticipatory guidelines. She politely declined a referral to [facility] OP at this time. Encouraged to pump whenever baby is getting formula; ideally 8 pumping sessions/24 hours. All questions and concerns answered, family to contact [facility] services PRN.  Objective Infant data: Mother's Current Feeding Choice: Breast Milk and Formula  Infant feeding assessment Scale for Readiness: 1 Scale for Quality: 2  Maternal data: G3P3003  C-Section, Low Transverse Current breast feeding challenges:: NICU admission Does the patient have breastfeeding experience prior to this delivery?: Yes How long did the patient breastfeed?: 6 months Pumping frequency: 3-4 times/24 hours Pumped volume: 0 mL (drops) Flange Size: 21 Risk factor for low milk supply:: maternal blood loss of 533 cc., infrequent pumping as 11/24/2022 Pump: Hands Free, [employer] Pump [employer])  Assessment Infant: Feeding Status: Ad lib  Maternal: Milk volume: Normal  Intervention/Plan Interventions: Breast feeding basics reviewed; DEBP; Education  Tools: Pump; Flanges; Hands-free pumping top (Size S/M per previous LC) Pump Education: Setup, frequency, and cleaning; Milk Storage  Plan: Consult Status:  Complete   Kayla Bradley S Kayla Bradley 11/24/2022, 3:02 PM

## 2022-11-24 NOTE — Progress Notes (Signed)
Patient discharge information given, all questions answered, pt verbalized understanding. Patient alert and oriented x4, ambulatory, VS and pain stable at time of discharge.

## 2022-11-29 ENCOUNTER — Ambulatory Visit (INDEPENDENT_AMBULATORY_CARE_PROVIDER_SITE_OTHER): Payer: Medicaid Other

## 2022-11-29 VITALS — BP 123/77 | HR 78 | Ht 63.0 in | Wt 183.0 lb

## 2022-11-29 DIAGNOSIS — Z98891 History of uterine scar from previous surgery: Secondary | ICD-10-CM | POA: Diagnosis not present

## 2022-11-29 NOTE — Progress Notes (Signed)
   POSTOPERATIVE VISIT NOTE   Subjective:     Kayla Bradley is a 32 y.o. [phone] who presents to [facility] for incision check s/p rLTCS on 11/21/2022. She has no complaints. Incision is healing well, has some itching occasionally. No drainage, fever, or tenderness. Using oxy mostly at night. Pain well controlled with Tylenol/Ibuprofen during the day.  The following portions of the patient's history were reviewed and updated as appropriate: allergies, current medications, past family history, past medical history, past social history, past surgical history, and problem list.  Review of Systems Pertinent items are noted in HPI.    Objective:    BP 123/77   Pulse 78   Ht 5' 3"$  (1.6 m)   Wt 183 lb (83 kg)   LMP 02/20/2022   Breastfeeding Yes   BMI 32.42 kg/m  General:  alert and no distress  Abdomen: soft, bowel sounds active, non-tender  Incision:   healing well, no drainage, no erythema, no hernia, no seroma, no swelling, no dehiscence, incision well approximated     Assessment:   Doing well postoperatively.    Plan:   Status post cesarean section routine postpartum follow-up 1. Continue current pain meds as needed. 2. Breast/formula feeding 3. Activity restrictions: as tolerated. Pelvic rest. No heavy lifting. 4. Follow up in 4-5 weeks for routine postpartum visit  Maryagnes Amos, CNM

## 2022-12-01 ENCOUNTER — Telehealth: Payer: Self-pay | Admitting: *Deleted

## 2022-12-01 NOTE — Telephone Encounter (Signed)
Voicemail full.

## 2022-12-07 ENCOUNTER — Telehealth: Payer: Self-pay | Admitting: *Deleted

## 2022-12-07 NOTE — Telephone Encounter (Signed)
Left patient an urgent message with updated appointment information for Tuesday, January 03, 2023.

## 2022-12-30 ENCOUNTER — Telehealth (HOSPITAL_COMMUNITY): Payer: Self-pay

## 2022-12-30 NOTE — Telephone Encounter (Signed)
Chart review.

## 2023-01-02 NOTE — Progress Notes (Unsigned)
[facility] Visit Note  Kayla Bradley is a 32 y.o. [phone] female who presents for a postpartum visit. She is 6 weeks postpartum following a repeat cesarean section.  I have fully reviewed the prenatal and intrapartum course. The delivery was at 39.1 gestational weeks.  Anesthesia: spinal. Postpartum course has been unremarkable. Baby is doing well. Baby is feeding by bottle - Jerlyn Ly Start . Bleeding thin lochia. Bowel function is normal. Bladder function is normal. Patient is not sexually active. Contraception method is IUD-will schedule for another day. Postpartum depression screening: negative.   The pregnancy intention screening data noted above was reviewed. Potential methods of contraception were discussed. The patient elected to proceed with No data recorded.   Edinburgh Postnatal Depression Scale - 01/03/23 0906       Edinburgh Postnatal Depression Scale:  In the Past 7 Days   I have been able to laugh and see the funny side of things. 0    I have looked forward with enjoyment to things. 0    I have blamed myself unnecessarily when things went wrong. 0    I have been anxious or worried for no good reason. 3    Things have been getting on top of me. 0    I have been so unhappy that I have had difficulty sleeping. 2    I have felt sad or miserable. 0    I have been so unhappy that I have been crying. 0    The thought of harming myself has occurred to me. 0             There are no preventive care reminders to display for this patient.  The following portions of the patient's history were reviewed and updated as appropriate: allergies, current medications, past family history, past medical history, past social history, past surgical history, and problem list.  Review of Systems Pertinent items are noted in HPI.  Objective:  BP 129/75   Pulse 75   Ht 5\' 3"  (1.6 m)   Wt 182 lb (82.6 kg)   LMP 02/20/2022   BMI 32.24 kg/m    General:  alert and no  distress   Breasts:  not indicated  Lungs: Effort normal  Heart:  Regular rate  Abdomen: soft, non-tender; bowel sounds normal; no masses,  no organomegaly   Wound well approximated incision  GU exam:  not indicated       Assessment:    1. Postpartum care and examination - Normal postpartum exam - GTT today - ppIUD fell out approximately 3 weeks ago. Patient was taking a shower and strings were halfway down thigh and then fell out. She has not been sexually active but does not want to have another placed today. She desires to wait another week or so. Reminded patient to abstain from intercourse until IUD is placed or use condoms  - Glucose Tolerance, 2 Hours w/1 Hour   Plan:   Essential components of care per ACOG recommendations:  1.  Mood and well being: Patient with negative depression screening today. Reviewed local resources for support.  - Patient tobacco use? No.   - hx of drug use? No.    2. Infant care and feeding:  -Patient currently breastmilk feeding? No.  -Social determinants of health (SDOH) reviewed in EPIC. No concerns  3. Sexuality, contraception and birth spacing - Patient does not want a pregnancy in the next year.  Desired family size is 3 children.  -  Reviewed reproductive life planning. Reviewed contraceptive methods based on pt preferences and effectiveness.  Patient desired IUD or IUS today.   - Discussed birth spacing of 18 months  4. Sleep and fatigue -Encouraged family/partner/community support of 4 hrs of uninterrupted sleep to help with mood and fatigue  5. Physical Recovery  - Discussed patients delivery and complications. - Patient had a C-section repeat; no problems after deliver.  - Patient has urinary incontinence? No. - Patient is safe to resume physical and sexual activity  6.  Health Maintenance - HM due items addressed Yes - Last pap smear  Diagnosis  Date Value Ref Range Status  03/16/2022   Final   - Negative for  intraepithelial lesion or malignancy (NILM)   Pap smear not done at today's visit.  -Breast Cancer screening indicated? No.   7. Chronic Disease/Pregnancy Condition follow up: None  - PCP follow up  Renee Harder, [place] for [place]

## 2023-01-03 ENCOUNTER — Ambulatory Visit (INDEPENDENT_AMBULATORY_CARE_PROVIDER_SITE_OTHER): Payer: Medicaid Other

## 2023-01-04 LAB — GLUCOSE TOLERANCE, 2 HOURS W/ 1HR
Glucose, 1 hour: 133 mg/dL (ref 70–179)
Glucose, 2 hour: 94 mg/dL (ref 70–152)
Glucose, Fasting: 97 mg/dL — ABNORMAL HIGH (ref 70–91)

## 2023-01-10 ENCOUNTER — Ambulatory Visit: Payer: Medicaid Other

## 2023-01-20 ENCOUNTER — Encounter: Payer: Self-pay | Admitting: Obstetrics and Gynecology

## 2023-01-20 ENCOUNTER — Ambulatory Visit (INDEPENDENT_AMBULATORY_CARE_PROVIDER_SITE_OTHER): Payer: Medicaid Other | Admitting: Obstetrics and Gynecology

## 2023-01-20 VITALS — BP 131/85 | HR 85 | Ht 63.0 in | Wt 180.0 lb

## 2023-01-20 DIAGNOSIS — Z30430 Encounter for insertion of intrauterine contraceptive device: Secondary | ICD-10-CM

## 2023-01-20 LAB — POCT URINE PREGNANCY: Preg Test, Ur: NEGATIVE

## 2023-01-20 MED ORDER — LEVONORGESTREL 20 MCG/DAY IU IUD
1.0000 | INTRAUTERINE_SYSTEM | Freq: Once | INTRAUTERINE | Status: AC
  Administered 2023-01-20: 1 via INTRAUTERINE

## 2023-01-20 NOTE — Progress Notes (Signed)
   GYNECOLOGY CLINIC PROCEDURE NOTE  Kayla Bradley is a 32 y.o. [phone] here for mirena IUD insertion. No GYN concerns.  Last pap smear was on 03/16/22 and was normal.  IUD Insertion Procedure Note Patient identified, informed consent performed, consent signed.   Discussed risks of irregular bleeding, cramping, infection, malpositioning or misplacement of the IUD outside the uterus which may require further procedure such as laparoscopy. Time out was performed.  Urine pregnancy test negative.  Speculum placed in the vagina.  Cervix visualized.  Cleaned with Betadine x 2.  Grasped anteriorly with a single tooth tenaculum.  Uterus sounded to 7 cm.  Mirena IUD placed per manufacturer's recommendations.  Strings trimmed to 3 cm. Tenaculum was removed, good hemostasis noted.  Patient tolerated procedure well.   Patient was given post-procedure instructions.  She was advised to have backup contraception for one week.  Patient was also asked to check IUD strings periodically and follow up in 4 weeks for IUD check.    Duane Lope, NP 01/20/2023 10:46 AM

## 2023-02-16 ENCOUNTER — Ambulatory Visit: Payer: Medicaid Other | Admitting: Obstetrics & Gynecology

## 2023-02-16 ENCOUNTER — Encounter: Payer: Self-pay | Admitting: Obstetrics & Gynecology

## 2023-02-16 VITALS — BP 141/87 | HR 69 | Ht 63.0 in | Wt 177.0 lb

## 2023-02-16 DIAGNOSIS — Z30431 Encounter for routine checking of intrauterine contraceptive device: Secondary | ICD-10-CM

## 2023-02-16 NOTE — Progress Notes (Signed)
    GYNECOLOGY OFFICE ENCOUNTER NOTE  History:  32 y.o. [id number] here today for today for IUD string check; Mirena  IUD was placed  01/20/2023. Usual breakthrough bleeding.  No complaints about the IUD, no concerning side effects.  The following portions of the patient's history were reviewed and updated as appropriate: allergies, current medications, past family history, past medical history, past social history, past surgical history and problem list. Last pap smear on 03/16/2022 was normal.  Review of Systems:  Pertinent items are noted in HPI.   Objective:  Physical Exam Blood pressure (!) 141/87, pulse 69, height 5\' 3"  (1.6 m), weight 177 lb (80.3 kg), not currently breastfeeding. CONSTITUTIONAL: Well-developed, well-nourished female in no acute distress.  NEUROLOGIC: Alert and oriented to person, place, and time. Normal reflexes, muscle tone coordination.  PSYCHIATRIC: Normal mood and affect. Normal behavior. Normal judgment and thought content. CARDIOVASCULAR: Normal heart rate noted RESPIRATORY: Effort and breath sounds normal, no problems with respiration noted ABDOMEN: Soft, no distention noted.   PELVIC: Normal appearing external genitalia; normal appearing vaginal mucosa and cervix. Scant bloody discharge noted.  IUD strings visualized, about 3 cm in length outside cervix. Done in the presence of a chaperone.   Assessment & Plan:  Patient was reassured about bleeding for up to 3-6 months after placement.  She can keep IUD in place for up to eight years; can come in for removal earlier if she desires or for any concerning side effects.    Jaynie Collins, MD, FACOG Obstetrician & Gynecologist, [facility] for [employer], [facility]

## 2023-03-14 ENCOUNTER — Encounter: Payer: Medicaid Other | Admitting: Sports Medicine

## 2023-03-23 ENCOUNTER — Encounter: Payer: Self-pay | Admitting: Family Medicine

## 2023-03-23 ENCOUNTER — Ambulatory Visit (INDEPENDENT_AMBULATORY_CARE_PROVIDER_SITE_OTHER): Payer: Medicaid Other | Admitting: Family Medicine

## 2023-03-23 VITALS — BP 130/85 | HR 71 | Temp 98.2°F | Resp 20 | Ht 62.0 in | Wt 172.7 lb

## 2023-03-23 DIAGNOSIS — M25511 Pain in right shoulder: Secondary | ICD-10-CM | POA: Diagnosis not present

## 2023-03-23 DIAGNOSIS — Z7689 Persons encountering health services in other specified circumstances: Secondary | ICD-10-CM | POA: Diagnosis not present

## 2023-03-23 DIAGNOSIS — G8929 Other chronic pain: Secondary | ICD-10-CM | POA: Diagnosis not present

## 2023-03-23 NOTE — Progress Notes (Signed)
New Patient Office Visit  Subjective    Patient ID: Kayla Bradley, female    DOB: 06/14/1991  Age: 32 y.o. MRN: [id number]  CC:  Chief Complaint  Patient presents with   Establish Care    Patient is here to establish care with new PCP, She states that she is having issues with her right should she states that she has been having pain in her right shoulder for about 4 weeks now , she says the pain is a dull pain that is in her collar bone area, shoulder blade, and upper arm area. She states that when she does have pain , on a pain scale its varies between a 4/ 5 at times.    HPI Kayla Bradley presents to establish care  Pt reports she has had 4 weeks hx of right shoulder pain. Can't pinpoint cause or reason. Constant pain/ache. She says it's near her collar bone and at her shoulder blade. She says when it started she had pain down the arm but that has gone away. Painful to lay on that side but still hurts with laying on her left side. Breathing deep or yawning hurts. Just had a baby 4 months ago and has 2 toddlers 1 and 31 y.o. she is right handed. She has been trying not to pick up the toddlers.  Has tried tylenol prn in the past, not helped much. Not breastfeeding.    Outpatient Encounter Medications as of 03/23/2023  Medication Sig   prenatal vitamin w/FE, FA (PRENATAL 1 + 1) 27-1 MG TABS tablet Take 1 tablet by mouth daily at 12 noon.   levonorgestrel (MIRENA) 20 MCG/DAY IUD 1 each by Intrauterine route once.   metFORMIN (GLUCOPHAGE) 500 MG tablet Take by mouth 2 (two) times daily with a meal. (Patient not taking: Reported on 02/16/2023)   No facility-administered encounter medications on file as of 03/23/2023.    Past Medical History:  Diagnosis Date   Dental infection    Dyspnea    Gestational diabetes    Postpartum hemorrhage    VSD (ventricular septal defect)    repaired by surgery at 32yrs old    Past Surgical History:  Procedure Laterality Date   CESAREAN  SECTION  02/04/2020   CESAREAN SECTION Bilateral 05/30/2021   Procedure: CESAREAN SECTION;  Surgeon: Milas Hock, MD;  Location: [facility] LD ORS;  Service: Obstetrics;  Laterality: Bilateral;   CESAREAN SECTION N/A 11/21/2022   Procedure: CESAREAN SECTION AND IUD INSERT;  Surgeon: Venora Maples, MD;  Location: [facility] LD ORS;  Service: Obstetrics;  Laterality: N/A;   VSD REPAIR     at 32 years old    Family History  Problem Relation Age of Onset   Diabetes Father    Diabetes Sister    Heart disease Maternal Grandfather    Asthma Neg Hx    Cancer Neg Hx    Hypertension Neg Hx    Stroke Neg Hx     Social History   Socioeconomic History   Marital status: Married    Spouse name: Not on file   Number of children: 1   Years of education: Not on file   Highest education level: Not on file  Occupational History   Occupation: Host    Comment: Landscape architect   Occupation: UNEMPLOYED  Tobacco Use   Smoking status: Former    Types: Cigarettes    Passive exposure: Past   Smokeless tobacco: Never  Vaping Use   Vaping Use:  Never used  Substance and Sexual Activity   Alcohol use: Yes    Alcohol/week: 1.0 standard drink of alcohol    Types: 1 Cans of beer per week    Comment: rarely   Drug use: Not Currently   Sexual activity: Yes    Partners: Male    Birth control/protection: I.U.D.  Other Topics Concern   Not on file  Social History Narrative   Not on file   Social Determinants of Health   Financial Resource Strain: Not on file  Food Insecurity: No Food Insecurity (11/24/2022)   Hunger Vital Sign    Worried About Running Out of Food in the Last Year: Never true    Ran Out of Food in the Last Year: Never true  Transportation Needs: No Transportation Needs (11/24/2022)   PRAPARE - Administrator, Civil Service (Medical): No    Lack of Transportation (Non-Medical): No  Physical Activity: Not on file  Stress: Not on file  Social Connections: Not on file  Intimate  Partner Violence: Not on file    Review of Systems  Musculoskeletal:  Positive for joint pain.       Right shoulder pain  All other systems reviewed and are negative.      Objective    BP 130/85   Pulse 71   Temp 98.2 F (36.8 C) (Oral)   Resp 20   Ht 5\' 2"  (1.575 m)   Wt 172 lb 11.2 oz (78.3 kg)   LMP 03/20/2023   SpO2 99%   Breastfeeding No   BMI 31.59 kg/m   Physical Exam Vitals reviewed.  Constitutional:      Appearance: She is normal weight.  HENT:     Head: Normocephalic and atraumatic.     Right Ear: External ear normal.     Left Ear: External ear normal.  Cardiovascular:     Rate and Rhythm: Normal rate.  Pulmonary:     Effort: Pulmonary effort is normal.  Genitourinary:    Comments: Pain at right shoulder with abduction Musculoskeletal:        General: Normal range of motion.  Skin:    General: Skin is warm.     Capillary Refill: Capillary refill takes less than 2 seconds.  Neurological:     General: No focal deficit present.     Mental Status: She is alert and oriented to person, place, and time. Mental status is at baseline.  Psychiatric:        Mood and Affect: Mood normal.        Thought Content: Thought content normal.        Judgment: Judgment normal.       Assessment & Plan:   Problem List Items Addressed This Visit   None Encounter to establish care with new doctor  Chronic right shoulder pain -     DG Shoulder Right; Future -     DG Scapula Right; Future   To send for xrays to rule out structural  abnormalities.  She has 3 little children at home. May be overuse injury. May do ice and tylenol prn for pain Follow up on xrays.    No follow-ups on file.   Suzan Slick, MD

## 2023-07-17 ENCOUNTER — Telehealth: Payer: Self-pay | Admitting: *Deleted

## 2023-07-17 NOTE — Telephone Encounter (Signed)
Received a voicemail  message this afternoon from a [employer] on behalf of [employer] stating she is calling re: date of service 11/21/22 . She states she is trying to verify if patient had a mirena placed and if any issues that resulted in a date of service 01/20/23 to get another mirena placed. Requests a call back to [phone] with reference ID of [id number]. Per chart review is a patient of [id number] . Will route to that office. Nancy Fetter

## 2023-07-19 ENCOUNTER — Ambulatory Visit: Payer: Medicaid Other

## 2023-07-19 DIAGNOSIS — G8929 Other chronic pain: Secondary | ICD-10-CM

## 2023-07-19 DIAGNOSIS — M25511 Pain in right shoulder: Secondary | ICD-10-CM

## 2023-07-25 ENCOUNTER — Ambulatory Visit: Payer: Medicaid Other

## 2023-07-25 ENCOUNTER — Encounter: Payer: Self-pay | Admitting: Family Medicine

## 2023-07-25 ENCOUNTER — Ambulatory Visit (INDEPENDENT_AMBULATORY_CARE_PROVIDER_SITE_OTHER): Payer: Medicaid Other | Admitting: Family Medicine

## 2023-07-25 VITALS — BP 122/81 | HR 77 | Temp 98.5°F | Resp 18 | Ht 62.0 in | Wt 171.0 lb

## 2023-07-25 DIAGNOSIS — M25511 Pain in right shoulder: Secondary | ICD-10-CM | POA: Diagnosis not present

## 2023-07-25 DIAGNOSIS — N939 Abnormal uterine and vaginal bleeding, unspecified: Secondary | ICD-10-CM

## 2023-07-25 DIAGNOSIS — R109 Unspecified abdominal pain: Secondary | ICD-10-CM

## 2023-07-25 DIAGNOSIS — Z975 Presence of (intrauterine) contraceptive device: Secondary | ICD-10-CM

## 2023-07-25 DIAGNOSIS — R1084 Generalized abdominal pain: Secondary | ICD-10-CM | POA: Diagnosis not present

## 2023-07-25 DIAGNOSIS — G8929 Other chronic pain: Secondary | ICD-10-CM | POA: Diagnosis not present

## 2023-07-25 NOTE — Progress Notes (Signed)
Acute Office Visit  Subjective:     Patient ID: Kayla Bradley, female    DOB: [birth year], 32 y.o.   MRN: [id number]  No chief complaint on file.   Abdominal Pain Patient is in today for acute visit.  Abdominal pain Pt has been midepigastric pain and right flank pain that started on Sunday. It hurts to bend over along with her vagina. She says she has been more constipated than her normal. She also has spasms in her abdomen. Pt also reports pain with intercourse since the birth of her child 8 months. LMP 2 weeks ago. She does report abnormal periods since 8  months. She does have IUD placed 8 months ago. She has appt with Ob next Thursday.  Right shoulder pain Pt was sent for xrays for shoulder pain. She has consistent pain. She was diagnosed with overuse injury. Xrays was negative.   Review of Systems  Gastrointestinal:  Positive for abdominal pain.  Musculoskeletal:  Positive for joint pain.  All other systems reviewed and are negative.      Objective:    LMP 07/12/2023 (Approximate)  BP Readings from Last 3 Encounters:  07/25/23 122/81  03/23/23 130/85  02/16/23 (!) 141/87      Physical Exam Vitals and nursing note reviewed.  Constitutional:      Appearance: Normal appearance. She is normal weight.  HENT:     Head: Normocephalic and atraumatic.     Right Ear: External ear normal.     Left Ear: External ear normal.     Nose: Nose normal.     Mouth/Throat:     Mouth: Mucous membranes are moist.     Pharynx: Oropharynx is clear.  Eyes:     Conjunctiva/sclera: Conjunctivae normal.     Pupils: Pupils are equal, round, and reactive to light.  Cardiovascular:     Rate and Rhythm: Normal rate.  Pulmonary:     Effort: Pulmonary effort is normal.  Abdominal:     General: Abdomen is flat. Bowel sounds are normal.  Skin:    General: Skin is warm.     Capillary Refill: Capillary refill takes less than 2 seconds.  Neurological:     General: No focal deficit  present.     Mental Status: She is alert and oriented to person, place, and time. Mental status is at baseline.  Psychiatric:        Mood and Affect: Mood normal.        Behavior: Behavior normal.        Thought Content: Thought content normal.        Judgment: Judgment normal.   No results found for any visits on 07/25/23.      Assessment & Plan:   Problem List Items Addressed This Visit   None  Generalized abdominal pain -     DG Abd 2 Views; Future  Vaginal bleeding -     US PELVIC COMPLETE WITH TRANSVAGINAL; Future  Chronic right shoulder pain -     Ambulatory referral to Sports Medicine   Pt does report constipation. May be source of abdominal pain? Send for plain xray.  Due to vaginal bleeding and IUD in place, send for TVUS to make sure IUD is in place and rule out other intrapelvic pathology. Pt has continued chronic right shoulder pain. Send to Sports medicine for further evaluation.  No orders of the defined types were placed in this encounter.   No follow-ups on file.  Suzan Slick,  MD

## 2023-07-26 ENCOUNTER — Ambulatory Visit: Payer: Medicaid Other

## 2023-07-26 DIAGNOSIS — N939 Abnormal uterine and vaginal bleeding, unspecified: Secondary | ICD-10-CM | POA: Diagnosis not present

## 2023-07-26 DIAGNOSIS — T8332XA Displacement of intrauterine contraceptive device, initial encounter: Secondary | ICD-10-CM

## 2023-08-03 ENCOUNTER — Ambulatory Visit: Payer: Medicaid Other | Admitting: Obstetrics and Gynecology

## 2023-08-14 ENCOUNTER — Encounter: Payer: Medicaid Other | Admitting: Sports Medicine

## 2023-08-16 ENCOUNTER — Encounter: Payer: Self-pay | Admitting: Family Medicine

## 2023-08-16 ENCOUNTER — Ambulatory Visit (INDEPENDENT_AMBULATORY_CARE_PROVIDER_SITE_OTHER): Payer: Medicaid Other | Admitting: Family Medicine

## 2023-08-16 VITALS — BP 138/85 | HR 76 | Temp 97.8°F | Resp 18 | Ht 62.0 in | Wt 170.2 lb

## 2023-08-16 DIAGNOSIS — R635 Abnormal weight gain: Secondary | ICD-10-CM

## 2023-08-16 DIAGNOSIS — Z6831 Body mass index (BMI) 31.0-31.9, adult: Secondary | ICD-10-CM

## 2023-08-16 MED ORDER — PHENTERMINE HCL 37.5 MG PO TABS
37.5000 mg | ORAL_TABLET | Freq: Every day | ORAL | 0 refills | Status: DC
Start: 2023-08-16 — End: 2023-09-19

## 2023-08-16 NOTE — Progress Notes (Signed)
Established Patient Office Visit  Subjective   Patient ID: Kayla Bradley, female    DOB: July 24, 1991  Age: 32 y.o. MRN: [id number]  Chief Complaint  Patient presents with   Weight Loss    Patient states that she managed to lose 10 lbs over a 9 month period and states that the last 2 months her weight will not budge    HPI  Weight  She reports she has reached a standstill with her weight loss. She reports she was on Adipex for a few weeks in the past that has helped. She reports her goal weight is 130. She has been trying to cut back on carbohydrates. She does go for walks with her children but understand this isn't a fast pace.    Review of Systems  Constitutional:        Weight gain  All other systems reviewed and are negative.     Objective:     BP 138/85   Pulse 76   Temp 97.8 F (36.6 C) (Oral)   Resp 18   Ht 5\' 2"  (1.575 m)   Wt 170 lb 3.2 oz (77.2 kg)   LMP 07/12/2023 (Approximate)   SpO2 100%   BMI 31.13 kg/m  BP Readings from Last 3 Encounters:  08/16/23 138/85  07/25/23 122/81  03/23/23 130/85      Physical Exam Vitals and nursing note reviewed.  Constitutional:      Appearance: Normal appearance. She is normal weight.  HENT:     Head: Normocephalic and atraumatic.     Right Ear: External ear normal.     Left Ear: External ear normal.     Nose: Nose normal.     Mouth/Throat:     Mouth: Mucous membranes are moist.     Pharynx: Oropharynx is clear.  Eyes:     Conjunctiva/sclera: Conjunctivae normal.     Pupils: Pupils are equal, round, and reactive to light.  Cardiovascular:     Rate and Rhythm: Normal rate.  Pulmonary:     Effort: Pulmonary effort is normal.  Skin:    General: Skin is warm.     Capillary Refill: Capillary refill takes less than 2 seconds.  Neurological:     General: No focal deficit present.     Mental Status: She is alert and oriented to person, place, and time. Mental status is at baseline.  Psychiatric:         Mood and Affect: Mood normal.        Behavior: Behavior normal.        Thought Content: Thought content normal.        Judgment: Judgment normal.     No results found for any visits on 08/16/23.     The ASCVD Risk score (Arnett DK, et al., 2019) failed to calculate for the following reasons:   The 2019 ASCVD risk score is only valid for ages 1 to 32    Assessment & Plan:   Problem List Items Addressed This Visit   None Visit Diagnoses     Weight gain    -  Primary   Relevant Medications   phentermine (ADIPEX-P) 37.5 MG tablet   BMI 31.0-31.9,adult       Relevant Medications   phentermine (ADIPEX-P) 37.5 MG tablet     To start trial of Adipex 37.5mg  x 30 days See back in 1 month for weight management  Return in about 4 weeks (around 09/13/2023) for Weight.    Marisa Cyphers  Sherie Don, MD

## 2023-08-23 ENCOUNTER — Ambulatory Visit: Payer: Medicaid Other | Admitting: Obstetrics and Gynecology

## 2023-08-23 ENCOUNTER — Encounter: Payer: Self-pay | Admitting: Obstetrics and Gynecology

## 2023-08-23 VITALS — BP 128/84 | HR 88 | Ht 62.0 in | Wt 167.0 lb

## 2023-08-23 DIAGNOSIS — Z30432 Encounter for removal of intrauterine contraceptive device: Secondary | ICD-10-CM | POA: Diagnosis not present

## 2023-08-23 DIAGNOSIS — T8332XA Displacement of intrauterine contraceptive device, initial encounter: Secondary | ICD-10-CM

## 2023-08-23 DIAGNOSIS — Z3009 Encounter for other general counseling and advice on contraception: Secondary | ICD-10-CM | POA: Diagnosis not present

## 2023-08-23 NOTE — Progress Notes (Signed)
GYNECOLOGY OFFICE VISIT NOTE  History:   Kayla Bradley is a 32 y.o. [phone] here today for malpositioned IUD. She developed heavy bleeding and cramping. PCP sent her for an [place] which showed IUD in the cervix/LUS.  She has an Mirena IUD. It is causing cervical pain and she would like it removed.   Ultimate plan is for spouse to get a vasectomy next year with tax refund. She is considering options until that time.   The following portions of the patient's history were reviewed and updated as appropriate: allergies, current medications, past family history, past medical history, past social history, past surgical history and problem list.   Health Maintenance:    Diagnosis  Date Value Ref Range Status  03/16/2022   Final   - Negative for intraepithelial lesion or malignancy (NILM)   Review of Systems:  Pertinent items noted in HPI and remainder of comprehensive ROS otherwise negative.  Physical Exam:  BP 128/84   Pulse 88   Ht 5\' 2"  (1.575 m)   Wt 167 lb (75.8 kg)   BMI 30.54 kg/m  CONSTITUTIONAL: Well-developed, well-nourished female in no acute distress.  HEENT:  Normocephalic, atraumatic. External right and left ear normal. No scleral icterus.  NECK: Normal range of motion, supple, no masses noted on observation SKIN: No rash noted. Not diaphoretic. No erythema. No pallor. MUSCULOSKELETAL: Normal range of motion. No edema noted. NEUROLOGIC: Alert and oriented to person, place, and time. Normal muscle tone coordination. No cranial nerve deficit noted. PSYCHIATRIC: Normal mood and affect. Normal behavior. Normal judgment and thought content.  PELVIC: Normal appearing external genitalia; normal urethral meatus; normal appearing vaginal mucosa and cervix.  No abnormal discharge noted.  Normal uterine size, no other palpable masses, no uterine or adnexal tenderness. Performed in the presence of a chaperone  Labs and Imaging US PELVIC COMPLETE WITH TRANSVAGINAL  Result Date:  08/15/2023 CLINICAL DATA:  32 year old female with Mirena IUD placed 8 months prior, now with heavy bleeding and cramping. C-section x3. LMP approximately 07/12/2023. Dyspareunia. EXAM: TRANSABDOMINAL AND TRANSVAGINAL ULTRASOUND OF PELVIS TECHNIQUE: Both transabdominal and transvaginal ultrasound examinations of the pelvis were performed. Transabdominal technique was performed for global imaging of the pelvis including uterus, ovaries, adnexal regions, and pelvic cul-de-sac. It was necessary to proceed with endovaginal exam following the transabdominal exam to visualize the endometrium and adnexa. COMPARISON:  None Available. FINDINGS: Uterus Measurements: 10.0 x 4.2 x 6.7 cm = volume: 149 mL. Anteverted uterus is normal in size and configuration, with no uterine fibroids or other myometrial abnormalities. Endometrium Thickness: 13 mm. No endometrial cavity fluid or focal endometrial mass. Abnormally positioned low lying IUD located predominantly in the upper cervical canal with distal IUD tip located in the lower uterine segment 3.3 cm from the fundal uterine cavity margin. Right ovary Measurements: 3.1 x 1.8 x 2.2 cm = volume: 6.3 mL (transabdominal measurements). Normal appearance/no adnexal mass. Left ovary Measurements: 5.4 x 3.5 x 2.7 cm = volume: 26.4 mL. Left ovary contains a hemorrhagic corpus luteum. No suspicious left ovarian or left adnexal masses. Other findings No abnormal free fluid. IMPRESSION: 1. Abnormally positioned low lying IUD located predominantly in the upper cervical canal with distal IUD tip located in the lower uterine segment. 2. Otherwise normal pelvic ultrasound. These results will be called to the ordering clinician or representative by the Radiologist Assistant, and communication documented in the PACS or [employer]. Electronically Signed   By: Delbert Phenix M.D.   On: 08/15/2023 09:29  IUD Removal  Patient identified, informed consent performed, consent signed.  Patient  was in the dorsal lithotomy position, normal external genitalia was noted.  A speculum was placed in the patient's vagina, normal discharge was noted, no lesions. The cervix was visualized, no lesions, no abnormal discharge.  The strings of the IUD were grasped and pulled using ring forceps. The IUD was removed in its entirety.   Patient tolerated the procedure well.     Assessment and Plan:   1. Malpositioned intrauterine device (IUD), initial encounter IUD removed at patient request.   2. Birth control Counseling We discussed birth control alternatives until vasectomy is effective She will consider and let me know - considering between OCPs and Nuvaring.   No orders of the defined types were placed in this encounter.    Routine preventative health maintenance measures emphasized. Please refer to After Visit Summary for other counseling recommendations.   Return if symptoms worsen or fail to improve.  Milas Hock, MD, FACOG Obstetrician & Gynecologist, [facility] for [facility], [facility]

## 2023-09-15 ENCOUNTER — Ambulatory Visit: Payer: Medicaid Other | Admitting: Family Medicine

## 2023-09-19 ENCOUNTER — Ambulatory Visit (INDEPENDENT_AMBULATORY_CARE_PROVIDER_SITE_OTHER): Payer: Medicaid Other | Admitting: Family Medicine

## 2023-09-19 ENCOUNTER — Encounter: Payer: Self-pay | Admitting: Family Medicine

## 2023-09-19 VITALS — BP 129/83 | HR 81 | Temp 98.2°F | Resp 18 | Ht 62.0 in | Wt 157.9 lb

## 2023-09-19 DIAGNOSIS — R635 Abnormal weight gain: Secondary | ICD-10-CM | POA: Diagnosis not present

## 2023-09-19 DIAGNOSIS — Z6828 Body mass index (BMI) 28.0-28.9, adult: Secondary | ICD-10-CM

## 2023-09-19 MED ORDER — PHENTERMINE HCL 37.5 MG PO TABS: 37.5000 mg | ORAL_TABLET | Freq: Every day | ORAL | 1 refills | Status: DC

## 2023-09-19 NOTE — Progress Notes (Signed)
Established Patient Office Visit  Subjective   Patient ID: Kayla Bradley, female    DOB: September 25, 1991  Age: 32 y.o. MRN: [id number]  Chief Complaint  Patient presents with   Medical Management of Chronic Issues    Patient is here for a weight management follow up    HPI Weight management  Pt was started on Adipex 37.5 mg a month ago. She reports she has been working on diet and exercise. She's lost 10 lbs in a month. Tolerating the medicine well. No palpitations, mood changes, or chest pains reported. She request refills on this today.   Review of Systems  All other systems reviewed and are negative.     Objective:     BP 129/83   Pulse 81   Temp 98.2 F (36.8 C) (Oral)   Resp 18   Ht 5\' 2"  (1.575 m)   Wt 157 lb 14.4 oz (71.6 kg)   SpO2 99%   BMI 28.88 kg/m  BP Readings from Last 3 Encounters:  09/19/23 129/83  08/23/23 128/84  08/16/23 138/85      Physical Exam Vitals and nursing note reviewed.  Constitutional:      Appearance: Normal appearance. She is normal weight.  HENT:     Head: Normocephalic and atraumatic.     Right Ear: External ear normal.     Left Ear: External ear normal.     Nose: Nose normal.     Mouth/Throat:     Mouth: Mucous membranes are moist.     Pharynx: Oropharynx is clear.  Eyes:     Conjunctiva/sclera: Conjunctivae normal.     Pupils: Pupils are equal, round, and reactive to light.  Cardiovascular:     Rate and Rhythm: Normal rate.  Pulmonary:     Effort: Pulmonary effort is normal.  Skin:    General: Skin is warm.     Capillary Refill: Capillary refill takes less than 2 seconds.  Neurological:     General: No focal deficit present.     Mental Status: She is alert and oriented to person, place, and time. Mental status is at baseline.  Psychiatric:        Mood and Affect: Mood normal.        Behavior: Behavior normal.        Thought Content: Thought content normal.        Judgment: Judgment normal.     No  results found for any visits on 09/19/23.     The ASCVD Risk score (Arnett DK, et al., 2019) failed to calculate for the following reasons:   The 2019 ASCVD risk score is only valid for ages 20 to 82    Assessment & Plan:   Problem List Items Addressed This Visit   None Visit Diagnoses       Weight gain    -  Primary   Relevant Medications   phentermine (ADIPEX-P) 37.5 MG tablet     BMI 28.0-28.9,adult       Relevant Medications   phentermine (ADIPEX-P) 37.5 MG tablet      Weight gain -     Phentermine HCl; Take 1 tablet (37.5 mg total) by mouth daily before breakfast.  Dispense: 30 tablet; Refill: 1  BMI 28.0-28.9,adult -     Phentermine HCl; Take 1 tablet (37.5 mg total) by mouth daily before breakfast.  Dispense: 30 tablet; Refill: 1   Pt has lost 10 lbs with initiation of Adipex. She would like to continue this  for now and request refills.  Return in about 3 months (around 12/18/2023) for Chronic condition follow up.    Suzan Slick, MD

## 2023-10-25 ENCOUNTER — Ambulatory Visit: Payer: Medicaid Other | Admitting: Family Medicine

## 2023-10-30 ENCOUNTER — Ambulatory Visit (INDEPENDENT_AMBULATORY_CARE_PROVIDER_SITE_OTHER): Payer: Medicaid Other | Admitting: Family Medicine

## 2023-10-30 ENCOUNTER — Encounter: Payer: Self-pay | Admitting: Family Medicine

## 2023-10-30 VITALS — BP 116/77 | HR 76 | Temp 98.0°F | Resp 18 | Ht 62.0 in | Wt 153.4 lb

## 2023-10-30 DIAGNOSIS — R591 Generalized enlarged lymph nodes: Secondary | ICD-10-CM | POA: Diagnosis not present

## 2023-10-30 NOTE — Progress Notes (Signed)
   Acute Office Visit  Subjective:     Patient ID: Kayla Bradley, female    DOB: 03/23/1991, 33 y.o.   MRN: [id number]  Chief Complaint  Patient presents with   upper body pain    Patient states that she has been having upper body right sided pain for a while now, she states that the pain is near her collar bone and goes around to her entire back     HPI Patient is in today for acute visit.  Pt reports she's feeling lumps underneath her skin near her axilla and underneath her collar bone on the right. She was seen for pain last fall and sent for xrays of right shoulder and scapula. Both were negative. She reports the pain is localized now to areas that are lumpy and painful to touch. One is located near her right collarbone. Another is located at her right axilla and one at her midsternum. She reports they are painful to touch. No recent illnesses reported per patient.   Review of Systems  Skin:        Skin lumps  All other systems reviewed and are negative.      Objective:    There were no vitals taken for this visit.   Physical Exam Vitals and nursing note reviewed.  Constitutional:      Appearance: Normal appearance. She is normal weight.  HENT:     Head: Normocephalic and atraumatic.     Right Ear: External ear normal.     Left Ear: External ear normal.     Nose: Nose normal.     Mouth/Throat:     Mouth: Mucous membranes are moist.     Pharynx: Oropharynx is clear.  Eyes:     Conjunctiva/sclera: Conjunctivae normal.     Pupils: Pupils are equal, round, and reactive to light.  Cardiovascular:     Rate and Rhythm: Normal rate and regular rhythm.     Pulses: Normal pulses.     Heart sounds: Normal heart sounds.  Pulmonary:     Effort: Pulmonary effort is normal.     Breath sounds: Normal breath sounds.  Skin:    General: Skin is warm.     Capillary Refill: Capillary refill takes less than 2 seconds.     Comments: Soft tissue lumps to midsternal region,  right clavicular and right axillary measuring 1/2 cm. Mobile   Neurological:     General: No focal deficit present.     Mental Status: She is alert and oriented to person, place, and time. Mental status is at baseline.  Psychiatric:        Mood and Affect: Mood normal.        Behavior: Behavior normal.        Thought Content: Thought content normal.        Judgment: Judgment normal.   No results found for any visits on 10/30/23.      Assessment & Plan:   Problem List Items Addressed This Visit   None Lymphadenopathy -     CBC with Differential/Platelet   Explained to pt I suspect lymphadenopathy. To screen for CBC . If abnormal, may proceed with ultrasound.   No orders of the defined types were placed in this encounter.   No follow-ups on file.  Suzan Slick, MD

## 2023-10-31 ENCOUNTER — Encounter: Payer: Self-pay | Admitting: Family Medicine

## 2023-10-31 LAB — CBC WITH DIFFERENTIAL/PLATELET
Basophils Absolute: 0.1 10*3/uL (ref 0.0–0.2)
Basos: 1 %
EOS (ABSOLUTE): 0.1 10*3/uL (ref 0.0–0.4)
Eos: 2 %
Hematocrit: 40.7 % (ref 34.0–46.6)
Hemoglobin: 13.6 g/dL (ref 11.1–15.9)
Immature Grans (Abs): 0 10*3/uL (ref 0.0–0.1)
Immature Granulocytes: 0 %
Lymphocytes Absolute: 2.6 10*3/uL (ref 0.7–3.1)
Lymphs: 33 %
MCH: 30.2 pg (ref 26.6–33.0)
MCHC: 33.4 g/dL (ref 31.5–35.7)
MCV: 90 fL (ref 79–97)
Monocytes Absolute: 0.4 10*3/uL (ref 0.1–0.9)
Monocytes: 5 %
Neutrophils Absolute: 4.7 10*3/uL (ref 1.4–7.0)
Neutrophils: 59 %
Platelets: 316 10*3/uL (ref 150–450)
RBC: 4.5 x10E6/uL (ref 3.77–5.28)
RDW: 12.3 % (ref 11.7–15.4)
WBC: 7.9 10*3/uL (ref 3.4–10.8)

## 2024-01-09 ENCOUNTER — Encounter: Payer: Self-pay | Admitting: Family Medicine

## 2024-01-09 ENCOUNTER — Ambulatory Visit: Admitting: Family Medicine

## 2024-01-09 VITALS — BP 124/81 | HR 76 | Temp 97.4°F | Resp 18 | Ht 62.0 in | Wt 145.0 lb

## 2024-01-09 DIAGNOSIS — R591 Generalized enlarged lymph nodes: Secondary | ICD-10-CM | POA: Diagnosis not present

## 2024-01-09 DIAGNOSIS — N6452 Nipple discharge: Secondary | ICD-10-CM | POA: Diagnosis not present

## 2024-01-09 NOTE — Progress Notes (Signed)
 Acute Office Visit  Subjective:     Patient ID: Kayla Bradley, female    DOB: [birth year], 33 y.o.   MRN: [id number]  Chief Complaint  Patient presents with   lymph nodules    Patient states that her right side of breast the nodule she felt was very hard. She also states that her right nipple is very itchy and she has had some drainage that started a month ago     HPI Patient is in today for acute visit.  Pt reports yesterday she was having achy pain on her right side chest and shoulder. She reports the lymph nodes that she was seen for in January, feels harder to her. She also reports right nipple pruritus along with discharge from the breast. She says this itchiness has been present for the last few weeks. She says the anterior chest is mostly painful. She denies family hx of breast cancer.  Review of Systems  Endo/Heme/Allergies:        Enlarged lymph nodes around right breast, itchy nipple on right breast with discharge  All other systems reviewed and are negative.      Objective:    BP 124/81   Pulse 76   Temp (!) 97.4 F (36.3 C) (Oral)   Resp 18   Ht 5\' 2"  (1.575 m)   Wt 145 lb (65.8 kg)   SpO2 100%   BMI 26.52 kg/m    Physical Exam Vitals and nursing note reviewed.  Constitutional:      Appearance: Normal appearance. She is normal weight.  HENT:     Head: Normocephalic and atraumatic.     Right Ear: External ear normal.     Left Ear: External ear normal.     Nose: Nose normal.     Mouth/Throat:     Mouth: Mucous membranes are moist.     Pharynx: Oropharynx is clear.  Eyes:     Conjunctiva/sclera: Conjunctivae normal.     Pupils: Pupils are equal, round, and reactive to light.  Cardiovascular:     Rate and Rhythm: Normal rate.  Pulmonary:     Effort: Pulmonary effort is normal.  Skin:    General: Skin is warm.     Capillary Refill: Capillary refill takes less than 2 seconds.     Comments: Subcutaneous hard nodule like structures to anterior  right chest wall near right breast  Neurological:     General: No focal deficit present.     Mental Status: She is alert and oriented to person, place, and time. Mental status is at baseline.  Psychiatric:        Mood and Affect: Mood normal.        Behavior: Behavior normal.        Thought Content: Thought content normal.        Judgment: Judgment normal.   No results found for any visits on 01/09/24.      Assessment & Plan:   Problem List Items Addressed This Visit   None  Breast discharge -     MM 3D DIAGNOSTIC MAMMOGRAM BILATERAL BREAST; Future -     US BREAST COMPLETE UNI RIGHT INC AXILLA; Future  Lymphadenopathy -     MM 3D DIAGNOSTIC MAMMOGRAM BILATERAL BREAST; Future -     US BREAST COMPLETE UNI RIGHT INC AXILLA; Future   Pt seen for enlarged lymph nodes in January appear to be worsening and becoming more hard now with right breast involvement. Will refer for bilateral  diagnostic mammogram with ultrasound right breast. Follow up pending results.  No orders of the defined types were placed in this encounter.   No follow-ups on file.  Suzan Slick, MD

## 2024-01-23 ENCOUNTER — Ambulatory Visit
Admission: RE | Admit: 2024-01-23 | Discharge: 2024-01-23 | Disposition: A | Discharge status: Home / Self Care | Source: Ambulatory Visit | Attending: Family Medicine | Admitting: Family Medicine

## 2024-01-23 ENCOUNTER — Other Ambulatory Visit: Payer: Self-pay | Admitting: Family Medicine

## 2024-01-23 ENCOUNTER — Encounter: Payer: Self-pay | Admitting: Family Medicine

## 2024-01-23 DIAGNOSIS — N6452 Nipple discharge: Secondary | ICD-10-CM

## 2024-01-23 DIAGNOSIS — R591 Generalized enlarged lymph nodes: Secondary | ICD-10-CM

## 2024-04-19 ENCOUNTER — Encounter: Payer: Self-pay | Admitting: Advanced Practice Midwife

## 2024-05-27 ENCOUNTER — Ambulatory Visit: Admitting: Family Medicine

## 2024-05-27 ENCOUNTER — Encounter: Payer: Self-pay | Admitting: Family Medicine

## 2024-05-27 VITALS — BP 120/83 | HR 77 | Temp 97.5°F | Resp 18 | Ht 62.0 in | Wt 152.8 lb

## 2024-05-27 DIAGNOSIS — Z1329 Encounter for screening for other suspected endocrine disorder: Secondary | ICD-10-CM | POA: Diagnosis not present

## 2024-05-27 DIAGNOSIS — R635 Abnormal weight gain: Secondary | ICD-10-CM

## 2024-05-27 DIAGNOSIS — R5382 Chronic fatigue, unspecified: Secondary | ICD-10-CM

## 2024-05-27 DIAGNOSIS — L709 Acne, unspecified: Secondary | ICD-10-CM | POA: Diagnosis not present

## 2024-05-27 DIAGNOSIS — Z131 Encounter for screening for diabetes mellitus: Secondary | ICD-10-CM

## 2024-05-27 NOTE — Progress Notes (Signed)
 Established Patient Office Visit  Subjective   Patient ID: Kayla Bradley, female    DOB: 22-Apr-1991  Age: 33 y.o. MRN: [id number]  Chief Complaint  Patient presents with   Follow-up    Patient would like to discuss possibly being tested for  DM, PCOS, and Thyroid  issues, she states that she has tried to loose weight and nothing is working and she states that her symptoms are; mostly gaining weight in her abdomen , fatigued everyday, and brain fog.     HPI  Weight Pt reports no matter what she does, she still gains weight. She is also fatigued daily and would like labs done. She says the weight goes to her stomach. She has hx of gestational DM. She reports she bikes and walking along with getting more sleep. She also says she has PCOS symptoms such as acne and stomach weight gain. She says she looks pregnant. She has hair growth after her chin and has to microblade.  Review of Systems  Constitutional:  Positive for malaise/fatigue.       Weight gain  All other systems reviewed and are negative.     Objective:     BP 120/83   Pulse 77   Temp (!) 97.5 F (36.4 C) (Oral)   Resp 18   Ht 5' 2 (1.575 m)   Wt 152 lb 12.8 oz (69.3 kg)   SpO2 99%   BMI 27.95 kg/m  BP Readings from Last 3 Encounters:  05/27/24 120/83  01/09/24 124/81  10/30/23 116/77      Physical Exam Vitals and nursing note reviewed.  Constitutional:      Appearance: Normal appearance. She is normal weight.  HENT:     Head: Normocephalic and atraumatic.     Right Ear: External ear normal.     Left Ear: External ear normal.     Nose: Nose normal.     Mouth/Throat:     Mouth: Mucous membranes are moist.     Pharynx: Oropharynx is clear.  Eyes:     Conjunctiva/sclera: Conjunctivae normal.     Pupils: Pupils are equal, round, and reactive to light.  Cardiovascular:     Rate and Rhythm: Normal rate.  Pulmonary:     Effort: Pulmonary effort is normal.  Skin:    General: Skin is warm.      Capillary Refill: Capillary refill takes less than 2 seconds.  Neurological:     General: No focal deficit present.     Mental Status: She is alert and oriented to person, place, and time. Mental status is at baseline.  Psychiatric:        Mood and Affect: Mood normal.        Behavior: Behavior normal.        Thought Content: Thought content normal.        Judgment: Judgment normal.      No results found for any visits on 05/27/24.  Last hemoglobin A1c Lab Results  Component Value Date   HGBA1C 5.2 05/26/2022   Last thyroid  functions Lab Results  Component Value Date   TSH 2.37 02/01/2022      The ASCVD Risk score (Arnett DK, et al., 2019) failed to calculate for the following reasons:   The 2019 ASCVD risk score is only valid for ages 52 to 77    Assessment & Plan:   Problem List Items Addressed This Visit       Other   Chronic fatigue   Other  Visit Diagnoses       Weight gain    -  Primary     Screening for thyroid  disorder       Relevant Orders   TSH + free T4     Screening for diabetes mellitus       Relevant Orders   Hemoglobin A1c     Acne, unspecified acne type       Relevant Orders   DHEA-sulfate      Weight gain  Chronic fatigue  Screening for thyroid  disorder -     TSH + free T4  Screening for diabetes mellitus -     Hemoglobin A1c  Acne, unspecified acne type -     DHEA-sulfate   Pt with weight gain, acne, and fatigue. Screening metabolic disorders such as diabetes, thyroid , PCOS. Follow up on results. Discussed dietary changes and printed handout on protein rich foods. Also advised pt to try [facility] for more structured weight loss program.  No follow-ups on file.    Torrence CINDERELLA Barrier, MD

## 2024-05-28 ENCOUNTER — Other Ambulatory Visit: Payer: Self-pay | Admitting: Family Medicine

## 2024-05-28 ENCOUNTER — Ambulatory Visit: Payer: Self-pay | Admitting: Family Medicine

## 2024-05-28 ENCOUNTER — Telehealth: Payer: Self-pay | Admitting: *Deleted

## 2024-05-28 ENCOUNTER — Telehealth: Payer: Self-pay

## 2024-05-28 DIAGNOSIS — E282 Polycystic ovarian syndrome: Secondary | ICD-10-CM

## 2024-05-28 DIAGNOSIS — R7989 Other specified abnormal findings of blood chemistry: Secondary | ICD-10-CM

## 2024-05-28 DIAGNOSIS — R1084 Generalized abdominal pain: Secondary | ICD-10-CM

## 2024-05-28 LAB — HEMOGLOBIN A1C
Est. average glucose Bld gHb Est-mCnc: 103 mg/dL
Hgb A1c MFr Bld: 5.2 % (ref 4.8–5.6)

## 2024-05-28 LAB — TSH+FREE T4
Free T4: 0.94 ng/dL (ref 0.82–1.77)
TSH: 1.66 u[IU]/mL (ref 0.450–4.500)

## 2024-05-28 LAB — DHEA-SULFATE: DHEA-SO4: 488 ug/dL — ABNORMAL HIGH (ref 84.8–378.0)

## 2024-05-28 MED ORDER — METFORMIN HCL ER 500 MG PO TB24: 500.0000 mg | ORAL_TABLET | Freq: Every day | ORAL | 1 refills | Status: DC

## 2024-05-28 NOTE — Telephone Encounter (Signed)
 Returned call from 1:50 PM. Voice mail is full.

## 2024-05-28 NOTE — Telephone Encounter (Signed)
 Patient spoke with provider regarding lab results via [facility] Chart  Copied from CRM #[id number]. Topic: Clinical - Lab/Test Results >> May 28, 2024 10:49 AM Herma G wrote: Reason for CRM: Pt states that her recent test results were abnormal, she would like a call back from a nurse to discuss at [phone],

## 2024-05-29 ENCOUNTER — Other Ambulatory Visit: Payer: Self-pay

## 2024-06-04 ENCOUNTER — Encounter: Payer: Self-pay | Admitting: Sports Medicine

## 2024-06-06 ENCOUNTER — Ambulatory Visit
Admission: RE | Admit: 2024-06-06 | Discharge: 2024-06-06 | Disposition: A | Discharge status: Home / Self Care | Source: Ambulatory Visit | Attending: Family Medicine | Admitting: Family Medicine

## 2024-06-06 ENCOUNTER — Other Ambulatory Visit

## 2024-06-06 DIAGNOSIS — R1084 Generalized abdominal pain: Secondary | ICD-10-CM

## 2024-06-06 DIAGNOSIS — R7989 Other specified abnormal findings of blood chemistry: Secondary | ICD-10-CM

## 2024-06-06 MED ORDER — IOPAMIDOL (ISOVUE-370) INJECTION 76%
80.0000 mL | Freq: Once | INTRAVENOUS | Status: AC | PRN
  Administered 2024-06-06: 80 mL via INTRAVENOUS

## 2024-06-10 ENCOUNTER — Ambulatory Visit: Payer: Self-pay | Admitting: Family Medicine

## 2024-06-20 ENCOUNTER — Ambulatory Visit: Admitting: Obstetrics and Gynecology

## 2024-06-20 ENCOUNTER — Encounter: Payer: Self-pay | Admitting: Obstetrics and Gynecology

## 2024-06-20 VITALS — BP 131/83 | HR 81 | Ht 62.0 in | Wt 153.0 lb

## 2024-06-20 DIAGNOSIS — R7989 Other specified abnormal findings of blood chemistry: Secondary | ICD-10-CM | POA: Diagnosis not present

## 2024-06-20 NOTE — Progress Notes (Signed)
 GYNECOLOGY OFFICE VISIT NOTE  History:  Kayla Bradley is a 33 y.o. [phone] here today for elevated DHEAS in setting of multiple other symptoms.   I reviewed the note from her PCP and she noted: - Weight gain - Unwanted hair growth on her chin - Fatigue - Brain fog  She has a history of GDM.   Her periods are regular. Heavier than they used to be but otherwise normal.    She has labwork from the PCP's office which I personally reviewed; TSH was normal along with free t4.  DHEA was elevated at 488.  A1C was normal at 5.2.   I reviewed her CT report done to check her adrenals in the setting of the elevated DHEAS which appeared normal.   After that appointment she started taking MTF but has not noticed much difference.   The following portions of the patient's history were reviewed and updated as appropriate: allergies, current medications, past family history, past medical history, past social history, past surgical history and problem list.   Health Maintenance:   Normal pap and negative HRHPV:   Diagnosis  Date Value Ref Range Status  03/16/2022   Final   - Negative for intraepithelial lesion or malignancy (NILM)   Review of Systems:  Pertinent items noted in HPI and remainder of comprehensive ROS otherwise negative.  Physical Exam:  BP 131/83   Pulse 81   Ht 5' 2 (1.575 m)   Wt 153 lb (69.4 kg)   LMP 06/10/2024 (Exact Date)   BMI 27.98 kg/m  CONSTITUTIONAL: Well-developed, well-nourished female in no acute distress.  HEENT:  Normocephalic, atraumatic. External right and left ear normal. No scleral icterus.  NECK: Normal range of motion, supple, no masses noted on observation SKIN: No rash noted. Not diaphoretic. No erythema. No pallor. MUSCULOSKELETAL: Normal range of motion. No edema noted. NEUROLOGIC: Alert and oriented to person, place, and time. Normal muscle tone coordination. No cranial nerve deficit noted. PSYCHIATRIC: Normal mood and affect.  Normal behavior. Normal judgment and thought content.  PELVIC: Deferred  Labs and Imaging No results found for this or any previous visit (from the past week). CT ABDOMEN W WO CONTRAST Result Date: 06/09/2024 CLINICAL DATA:  Left upper quadrant abdominal pain, adrenal protocol. EXAM: CT ABDOMEN WITHOUT AND WITH CONTRAST TECHNIQUE: Multidetector CT imaging of the abdomen was performed following the standard protocol before and following the bolus administration of intravenous contrast. RADIATION DOSE REDUCTION: This exam was performed according to the departmental dose-optimization program which includes automated exposure control, adjustment of the mA and/or kV according to patient size and/or use of iterative reconstruction technique. CONTRAST:  80mL ISOVUE -370 IOPAMIDOL  (ISOVUE -370) INJECTION 76% COMPARISON:  None Available. FINDINGS: Lower chest: No acute abnormality. Hepatobiliary: No suspicious hepatic lesion. Gallbladder is unremarkable. No biliary ductal dilation. Pancreas: No pancreatic ductal dilation or evidence of acute inflammation. Spleen: No splenomegaly. Adrenals/Urinary Tract: No suspicious adrenal nodule/mass. No hydronephrosis. Too small to accurately characterize right interpolar renal lesion on image 79/8 is statistically likely a cyst. Stomach/Bowel: Colonic stool burden suggestive of constipation. No evidence of bowel obstruction. Vascular/Lymphatic: No significant vascular findings are present. No enlarged abdominal lymph nodes. Other: No significant abdominal free fluid. Musculoskeletal: No acute osseous finding. IMPRESSION: 1. No acute abnormality in the abdomen. 2. No suspicious adrenal nodule/mass. 3. Colonic stool burden suggestive of constipation. Electronically Signed   By: Reyes Holder M.D.   On: 06/09/2024 09:25    Assessment and Plan:   Assessment & Plan  Abnormal weight gain, fatigue, brain fog, and hirsutism Presents with symptoms atypical for PCOS. Elevated DHEA  sulfate noted. Differential includes PCOS, adrenal hyperplasia, and Cushing's syndrome. Workup ongoing. - Order fasting blood tests: cortisol, 17-hydroxyprogesterone, free and total testosterone, prolactin, LH, recheck DHEA sulfate. - Advise fasting morning blood tests for accuracy. - Consider endocrinology referral if PCOS is ruled out or further expertise is needed. - Initiated Metformin  for potential weight loss. - Birth control may help with androgen-related symptoms but would not help with weight gain, fatigue and brain fog.    Labs ordered: -     17-Hydroxyprogesterone -     Estradiol -     Follicle stimulating hormone -     Testosterone,Free and Total -     Prolactin -     Luteinizing hormone -     DHEA-sulfate -     Cortisol   Routine preventative health maintenance measures emphasized. Please refer to After Visit Summary for other counseling recommendations.   No follow-ups on file.  Vina Solian, MD, FACOG Obstetrician & Gynecologist, [facility] for [facility], [facility]

## 2024-07-04 LAB — 17-HYDROXYPROGESTERONE: 17-OH Progesterone LCMS: 252 ng/dL

## 2024-07-04 LAB — CORTISOL: Cortisol: 17.1 ug/dL (ref 6.2–19.4)

## 2024-07-04 LAB — TESTOSTERONE,FREE AND TOTAL
Testosterone, Free: 6.4 pg/mL — ABNORMAL HIGH (ref 0.0–4.2)
Testosterone: 35 ng/dL (ref 8–60)

## 2024-07-04 LAB — PROLACTIN: Prolactin: 6.9 ng/mL (ref 4.8–33.4)

## 2024-07-04 LAB — DHEA-SULFATE: DHEA-SO4: 506 ug/dL — ABNORMAL HIGH (ref 84.8–378.0)

## 2024-07-04 LAB — FOLLICLE STIMULATING HORMONE: FSH: 2.1 m[IU]/mL

## 2024-07-04 LAB — LUTEINIZING HORMONE: LH: 6.8 m[IU]/mL

## 2024-07-04 LAB — ESTRADIOL: Estradiol: 193 pg/mL

## 2024-07-08 ENCOUNTER — Ambulatory Visit: Payer: Self-pay | Admitting: Obstetrics and Gynecology

## 2024-07-08 DIAGNOSIS — R7989 Other specified abnormal findings of blood chemistry: Secondary | ICD-10-CM

## 2024-08-21 ENCOUNTER — Ambulatory Visit: Payer: Self-pay

## 2024-08-21 NOTE — Telephone Encounter (Signed)
 FYI Only or Action Required?: FYI only for provider: appointment scheduled on 08/23/24.  Patient was last seen in primary care on 05/27/2024 by Colette Torrence GRADE, MD.  Called Nurse Triage reporting Weight Gain.  Symptoms began several weeks ago.  Interventions attempted: Nothing.  Symptoms are: unchanged.  Triage Disposition: See PCP When Office is Open (Within 3 Days)  Patient/caregiver understands and will follow disposition?: Yes     Reason for Disposition  MODERATE anxiety (e.g., persistent or frequent anxiety symptoms; interferes with sleep, school, or work)  Answer Assessment - Initial Assessment Questions No available appts with pcp. Offered appt tomorrow, pt declines; scheduled 08/23/24.  Declines BH resources.  Advised call back or UC/ED if symptoms worsen.  1. CONCERN: Did anything happen that prompted you to call today?      Hormonal imbalance 2. ANXIETY SYMPTOMS: Can you describe how you (your loved one; patient) have been feeling? (e.g., tense, restless, panicky, anxious, keyed up, overwhelmed, sense of impending doom).      Weight gain, depression; r/t to high testosterone , hair growth, brain fog, fatigue 3. ONSET: How long have you been feeling this way? (e.g., hours, days, weeks)     Few months 4. SEVERITY: How would you rate the level of anxiety? (e.g., 0 - 10; or mild, moderate, severe).     depression 5. FUNCTIONAL IMPAIRMENT: How have these feelings affected your ability to do daily activities? Have you had more difficulty than usual doing your normal daily activities? (e.g., getting better, same, worse; self-care, school, work, interactions)     Yes, able to function 6. HISTORY: Have you felt this way before? Have you ever been diagnosed with an anxiety problem in the past? (e.g., generalized anxiety disorder, panic attacks, PTSD). If Yes, ask: How was this problem treated? (e.g., medicines, counseling, etc.)     Depression, 6 years  ago 7. RISK OF HARM - SUICIDAL IDEATION: Do you ever have thoughts of hurting or killing yourself? If Yes, ask:  Do you have these feelings now? Do you have a plan on how you would do this?     no 8. TREATMENT:  What has been done so far to treat this anxiety? (e.g., medicines, relaxation strategies). What has helped?     no 9. THERAPIST: Do you have a counselor or therapist? If Yes, ask: What is their name?     no  11. PATIENT SUPPORT: Who is with you now? Who do you live with? Do you have family or friends who you can talk to?        yes 12. OTHER SYMPTOMS: Do you have any other symptoms? (e.g., feeling depressed, trouble concentrating, trouble sleeping, trouble breathing, palpitations or fast heartbeat, chest pain, sweating, nausea, or diarrhea)       Trouble sleeping  Protocols used: Anxiety and Panic Attack-A-AH Message from Bent E sent at 08/21/2024  2:12 PM EST  Summary: Hormonal imbalance, seeking advice   Reason for Triage: Pt is experiencing a hormonal imbalance, has questions for the clinic please advise  Best contact: [phone]  Is scheduled for TOC with Dr. Chandra 12/31.

## 2024-08-23 ENCOUNTER — Ambulatory Visit: Admitting: Family Medicine

## 2024-08-23 NOTE — Progress Notes (Incomplete)
 [facility] PRIMARY CARE [facility] [address] [place] [address] Dept: [phone] Dept Fax: [phone]  Office Visit  Subjective:    Patient ID: Kayla Bradley, female    DOB: 1991/04/16, 33 y.o..   MRN: [id number]  No chief complaint on file.  History of Present Illness:  Patient is in today for depression.     Past Medical History: Patient Active Problem List   Diagnosis Date Noted   Gestational diabetes 05/26/2022   Chronic fatigue 02/01/2022   Witnessed episode of apnea 02/01/2022   H/O: C-section 11/17/2020   History of congenital heart defect 11/17/2020   Asymptomatic varicose veins of both lower extremities 08/10/2020   History of open heart surgery 12/24/2019   Sleeps in sitting position due to orthopnea 12/24/2019   Positive ANA (antinuclear antibody) 11/22/2016   Thoracic scoliosis 08/08/2012   Past Surgical History:  Procedure Laterality Date   CESAREAN SECTION  02/04/2020   CESAREAN SECTION Bilateral 05/30/2021   Procedure: CESAREAN SECTION;  Surgeon: Cleatus Moccasin, MD;  Location: [facility] LD ORS;  Service: Obstetrics;  Laterality: Bilateral;   CESAREAN SECTION N/A 11/21/2022   Procedure: CESAREAN SECTION AND IUD INSERT;  Surgeon: Lola Donnice CHRISTELLA, MD;  Location: [facility] LD ORS;  Service: Obstetrics;  Laterality: N/A;   VSD REPAIR     at 33 years old   Family History  Problem Relation Age of Onset   Diabetes Father    Diabetes Sister    Heart disease Maternal Grandfather    Asthma Neg Hx    Cancer Neg Hx    Hypertension Neg Hx    Stroke Neg Hx    Outpatient Medications Prior to Visit  Medication Sig Dispense Refill   metFORMIN  (GLUCOPHAGE -XR) 500 MG 24 hr tablet TAKE 1 TABLET(500 MG) BY MOUTH DAILY WITH BREAKFAST 90 tablet 0   No facility-administered medications prior to visit.   Allergies  Allergen Reactions   Escitalopram Other (See Comments)    Made patient feel crazy     Objective:   There were no  vitals filed for this visit. There is no height or weight on file to calculate BMI.   General: Well developed, well nourished. No acute distress. HEENT: Normocephalic, non-traumatic. PERRL, EOMI. Conjunctiva clear. External ears normal. EAC and TMs normal bilaterally. Nose    clear without congestion or rhinorrhea. Mucous membranes moist. Oropharynx clear. Good dentition. Neck: Supple. No lymphadenopathy. No thyromegaly. Lungs: Clear to auscultation bilaterally. No wheezing, rales or rhonchi. CV: RRR without murmurs or rubs. Pulses 2+ bilaterally. Abdomen: Soft, non-tender. Bowel sounds positive, normal pitch and frequency. No hepatosplenomegaly. No rebound or guarding. Back: Straight. No CVA tenderness bilaterally. Extremities: Full ROM. No joint swelling or tenderness. No edema noted. Skin: Warm and dry. No rashes. Neuro: CN II-XII intact. Normal sensation and DTR bilaterally. Psych: Alert and oriented. Normal mood and affect.  Health Maintenance Due  Topic Date Due   Hepatitis B Vaccines 19-59 Average Risk (1 of 3 - 19+ 3-dose series) Never done   HPV VACCINES (1 - 3-dose SCDM series) Never done   Influenza Vaccine  05/03/2024    Lab Results {Labs (Optional):29002}    Assessment & Plan:   Problem List Items Addressed This Visit   None   No follow-ups on file.    Kayla CHRISTELLA Simpler, MD  I,Kayla Bradley,acting as a scribe for Kayla CHRISTELLA Simpler, MD.,have documented all relevant documentation on the behalf of Kayla CHRISTELLA Simpler, MD.  Kayla Bradley Kayla CHRISTELLA Simpler,  MD, have reviewed all documentation for this visit. The documentation on 08/23/2024 for the exam, diagnosis, procedures, and orders are all accurate and complete.

## 2024-08-27 ENCOUNTER — Encounter: Payer: Self-pay | Admitting: Family Medicine

## 2024-08-27 ENCOUNTER — Ambulatory Visit: Admitting: Family Medicine

## 2024-08-27 VITALS — BP 120/79 | HR 78 | Ht 62.0 in | Wt 160.0 lb

## 2024-08-27 DIAGNOSIS — F321 Major depressive disorder, single episode, moderate: Secondary | ICD-10-CM | POA: Diagnosis not present

## 2024-08-27 MED ORDER — BUPROPION HCL ER (XL) 150 MG PO TB24: 150.0000 mg | ORAL_TABLET | Freq: Every day | ORAL | 0 refills | Status: AC

## 2024-08-27 NOTE — Progress Notes (Signed)
 Established Patient Office Visit  Subjective   Patient ID: Kayla Bradley, female    DOB: Jun 28, 1991  Age: 33 y.o. MRN: [id number]  Chief Complaint  Patient presents with   Medication Refill    Metformin  - dx codes - Elevated DHEA; PCOS - because of high testosterone  GYN tested levels again and were higher - considered PCOS but referred her to endocrinology - was on metformin  for a month and levels elevated - patient continued for a few months but has now stopped.   Weight Loss    Depression because of gaining weight feels like stomach is huge very uncomfortable  Pt would like to start a journey for weight loss Both parents have diabetes  Pt has had heart surgery    Waiting for endocrine specialist to addess hormone levels that were drawn by GYN.   Weight gain: Has been taking metformin  XR 500 mg daily. Has not lost weight with metformin , has gained more weight.  Has Medicaid, unsure if GLP-1 are covered.  Has tried phentermine  in the past.  Gained weight back after stopping.  Was trying to keep calories under 2000 calories per day. Rides bike  for 10 miles for 30 minutes 7 days per week. Misses occasional night of exercise.  Lately has not been as dedicated. Walks with kids at park. Plays with kids on playground.  Discussed healthy weight and wellness, does not have time. Depression over weight gain: Has gained 7 pounds since 06/20/24 visit.  Willing to try Wellbutrin  for depression and to decrease appetite. PHQ-9 score: 6  No thoughts of self harm.   Has high testosterone  level, saw GYN Has an appointment with endocrine in February for evaluation.          ROS    Objective:     BP 120/79 (BP Location: Left Arm, Patient Position: Sitting, Cuff Size: Normal)   Pulse 78   Ht 5' 2 (1.575 m)   Wt 160 lb (72.6 kg)   SpO2 99%   BMI 29.26 kg/m    Physical Exam Vitals and nursing note reviewed.  Constitutional:      General: She is not in acute  distress.    Appearance: Normal appearance.  Cardiovascular:     Rate and Rhythm: Normal rate and regular rhythm.  Pulmonary:     Effort: Pulmonary effort is normal.     Breath sounds: Normal breath sounds.  Skin:    General: Skin is warm and dry.  Neurological:     General: No focal deficit present.     Mental Status: She is alert. Mental status is at baseline.  Psychiatric:        Behavior: Behavior normal.        Thought Content: Thought content normal.        Judgment: Judgment normal.       08/27/2024   10:19 AM 05/27/2024   11:01 AM 05/27/2024   10:44 AM 03/23/2023    4:11 PM 11/17/2022   10:23 AM  Depression screen PHQ 2/9  Decreased Interest 1 0 0 0   Down, Depressed, Hopeless 1 0 0 0   PHQ - 2 Score 2 0 0 0   Altered sleeping 1  0 0 0  Tired, decreased energy 1  0 3 1  Change in appetite 1  0 1 0  Feeling bad or failure about yourself  0   0 0  Trouble concentrating 1  0 3 0  Moving slowly or fidgety/restless  0  0 0 0  Suicidal thoughts 0  0 0 0  PHQ-9 Score 6  0  7    Difficult doing work/chores   Not difficult at all Somewhat difficult      Data saved with a previous flowsheet row definition     No results found for any visits on 08/27/24.    The ASCVD Risk score (Arnett DK, et al., 2019) failed to calculate for the following reasons:   The 2019 ASCVD risk score is only valid for ages 78 to 59    Assessment & Plan:   Problem List Items Addressed This Visit     Depression, major, single episode, moderate (HCC) - Primary   Endorses depression over weight gain. PHQ-9 score: 6 today. No thoughts of self harm. Wellbutrin  XL 150 mg daily for depression. Discussed possible side effect of decreased appetite after 1-2 months of treatment. Long discussion around healthy eating with adequate protein, healthy fats and increasing high nutrient low calorie foods such as fruits and vegetables. Recommend mediterranean style diet. Continue to exercise for physical  health and mental health. Follow-up with this provider as needed. Follow-up with PCP in 3 months.        Relevant Medications   buPROPion  (WELLBUTRIN  XL) 150 MG 24 hr tablet  Agrees with plan of care discussed.  Questions answered.   Return in about 7 weeks (around 10/14/2024) for Wellbutrin  med management .    Darice JONELLE Brownie, FNP

## 2024-08-27 NOTE — Assessment & Plan Note (Signed)
 Endorses depression over weight gain. PHQ-9 score: 6 today. No thoughts of self harm. Wellbutrin  XL 150 mg daily for depression. Discussed possible side effect of decreased appetite after 1-2 months of treatment. Long discussion around healthy eating with adequate protein, healthy fats and increasing high nutrient low calorie foods such as fruits and vegetables. Recommend mediterranean style diet. Continue to exercise for physical health and mental health. Follow-up with this provider as needed. Follow-up with PCP in 3 months.

## 2024-09-16 ENCOUNTER — Ambulatory Visit: Payer: Self-pay

## 2024-09-16 ENCOUNTER — Encounter: Payer: Self-pay | Admitting: Family Medicine

## 2024-09-16 NOTE — Telephone Encounter (Signed)
 Copied from CRM #[id number]. Topic: Clinical - Red Word Triage >> Sep 16, 2024 10:41 AM China J wrote: [facility] that prompted transfer to Nurse Triage: The patient has been on buPROPion  (WELLBUTRIN  XL) 150 MG for 3 weeks and since taking the medication, she has had hand swelling and itching, along with feet itching.

## 2024-10-02 ENCOUNTER — Encounter: Admitting: Family Medicine

## 2024-10-15 ENCOUNTER — Ambulatory Visit: Admitting: Family Medicine

## 2024-10-17 ENCOUNTER — Ambulatory Visit: Admitting: Family Medicine

## 2024-10-21 ENCOUNTER — Ambulatory Visit: Admitting: Family Medicine

## 2024-11-12 ENCOUNTER — Ambulatory Visit: Admitting: "Endocrinology
